# Patient Record
Sex: Female | Born: 1945 | Race: White | Hispanic: No | Marital: Married | Smoking: Never smoker
Health system: Southern US, Community
[De-identification: ages and names within clinical notes are randomized; demographics above are authoritative.]

## PROBLEM LIST (undated history)

## (undated) DIAGNOSIS — E785 Hyperlipidemia, unspecified: Secondary | ICD-10-CM

## (undated) DIAGNOSIS — M1990 Unspecified osteoarthritis, unspecified site: Secondary | ICD-10-CM

## (undated) DIAGNOSIS — M8580 Other specified disorders of bone density and structure, unspecified site: Secondary | ICD-10-CM

## (undated) HISTORY — DX: Unspecified osteoarthritis, unspecified site: M19.90

## (undated) HISTORY — DX: Hyperlipidemia, unspecified: E78.5

## (undated) HISTORY — DX: Other specified disorders of bone density and structure, unspecified site: M85.80

---
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
## 1998-02-25 ENCOUNTER — Other Ambulatory Visit: Admission: RE | Admit: 1998-02-25 | Discharge: 1998-02-25 | Payer: Self-pay | Admitting: Obstetrics and Gynecology

## 1998-03-10 ENCOUNTER — Other Ambulatory Visit: Admission: RE | Admit: 1998-03-10 | Discharge: 1998-03-10 | Payer: Self-pay | Admitting: Obstetrics and Gynecology

## 1999-03-06 ENCOUNTER — Other Ambulatory Visit: Admission: RE | Admit: 1999-03-06 | Discharge: 1999-03-06 | Payer: Self-pay | Admitting: Obstetrics and Gynecology

## 1999-05-30 ENCOUNTER — Encounter (INDEPENDENT_AMBULATORY_CARE_PROVIDER_SITE_OTHER): Payer: Self-pay | Admitting: Specialist

## 1999-05-30 ENCOUNTER — Other Ambulatory Visit: Admission: RE | Admit: 1999-05-30 | Discharge: 1999-05-30 | Payer: Self-pay | Admitting: Obstetrics and Gynecology

## 1999-09-14 ENCOUNTER — Other Ambulatory Visit: Admission: RE | Admit: 1999-09-14 | Discharge: 1999-09-14 | Payer: Self-pay | Admitting: Obstetrics and Gynecology

## 2000-03-06 ENCOUNTER — Other Ambulatory Visit: Admission: RE | Admit: 2000-03-06 | Discharge: 2000-03-06 | Payer: Self-pay | Admitting: Obstetrics and Gynecology

## 2002-10-12 ENCOUNTER — Other Ambulatory Visit: Admission: RE | Admit: 2002-10-12 | Discharge: 2002-10-12 | Payer: Self-pay | Admitting: Obstetrics and Gynecology

## 2003-11-10 ENCOUNTER — Other Ambulatory Visit: Admission: RE | Admit: 2003-11-10 | Discharge: 2003-11-10 | Payer: Self-pay | Admitting: Obstetrics and Gynecology

## 2004-11-13 ENCOUNTER — Other Ambulatory Visit: Admission: RE | Admit: 2004-11-13 | Discharge: 2004-11-13 | Payer: Self-pay | Admitting: *Deleted

## 2005-11-27 ENCOUNTER — Other Ambulatory Visit: Admission: RE | Admit: 2005-11-27 | Discharge: 2005-11-27 | Payer: Self-pay | Admitting: Obstetrics & Gynecology

## 2006-06-26 ENCOUNTER — Emergency Department (HOSPITAL_COMMUNITY): Admission: EM | Admit: 2006-06-26 | Discharge: 2006-06-26 | Payer: Self-pay | Admitting: Emergency Medicine

## 2006-12-11 ENCOUNTER — Encounter: Admission: RE | Admit: 2006-12-11 | Discharge: 2006-12-11 | Payer: Self-pay | Admitting: Orthopedic Surgery

## 2006-12-16 ENCOUNTER — Encounter: Admission: RE | Admit: 2006-12-16 | Discharge: 2006-12-16 | Payer: Self-pay | Admitting: Orthopedic Surgery

## 2006-12-23 ENCOUNTER — Other Ambulatory Visit: Admission: RE | Admit: 2006-12-23 | Discharge: 2006-12-23 | Payer: Self-pay | Admitting: Obstetrics & Gynecology

## 2007-12-29 ENCOUNTER — Other Ambulatory Visit: Admission: RE | Admit: 2007-12-29 | Discharge: 2007-12-29 | Payer: Self-pay | Admitting: Obstetrics & Gynecology

## 2008-08-26 ENCOUNTER — Ambulatory Visit: Payer: Self-pay | Admitting: Internal Medicine

## 2009-05-11 ENCOUNTER — Encounter: Admission: RE | Admit: 2009-05-11 | Discharge: 2009-05-11 | Payer: Self-pay | Admitting: Interventional Cardiology

## 2009-11-18 ENCOUNTER — Ambulatory Visit: Payer: Self-pay | Admitting: Internal Medicine

## 2009-11-22 ENCOUNTER — Ambulatory Visit: Payer: Self-pay | Admitting: Internal Medicine

## 2009-11-28 ENCOUNTER — Ambulatory Visit: Payer: Self-pay | Admitting: Internal Medicine

## 2010-03-29 ENCOUNTER — Ambulatory Visit (HOSPITAL_COMMUNITY): Admission: RE | Admit: 2010-03-29 | Discharge: 2010-03-29 | Payer: Self-pay | Admitting: Endocrinology

## 2010-06-27 ENCOUNTER — Ambulatory Visit (HOSPITAL_COMMUNITY): Admission: RE | Admit: 2010-06-27 | Discharge: 2010-06-27 | Payer: Self-pay | Admitting: Surgery

## 2010-08-13 HISTORY — PX: PARATHYROIDECTOMY: SHX19

## 2010-10-24 LAB — DIFFERENTIAL
Basophils Relative: 1 % (ref 0–1)
Lymphocytes Relative: 31 % (ref 12–46)
Lymphs Abs: 1.1 10*3/uL (ref 0.7–4.0)
Monocytes Absolute: 0.3 10*3/uL (ref 0.1–1.0)
Monocytes Relative: 10 % (ref 3–12)
Neutro Abs: 2 10*3/uL (ref 1.7–7.7)
Neutrophils Relative %: 55 % (ref 43–77)

## 2010-10-24 LAB — CBC
HCT: 38.6 % (ref 36.0–46.0)
MCHC: 34.3 g/dL (ref 30.0–36.0)
MCV: 92.7 fL (ref 78.0–100.0)
Platelets: 196 10*3/uL (ref 150–400)
RDW: 13.2 % (ref 11.5–15.5)
WBC: 3.6 10*3/uL — ABNORMAL LOW (ref 4.0–10.5)

## 2010-10-24 LAB — COMPREHENSIVE METABOLIC PANEL
Albumin: 4.2 g/dL (ref 3.5–5.2)
BUN: 9 mg/dL (ref 6–23)
Calcium: 10.9 mg/dL — ABNORMAL HIGH (ref 8.4–10.5)
Creatinine, Ser: 0.57 mg/dL (ref 0.4–1.2)
Glucose, Bld: 86 mg/dL (ref 70–99)
Potassium: 4.9 mEq/L (ref 3.5–5.1)
Total Protein: 6.9 g/dL (ref 6.0–8.3)

## 2010-10-24 LAB — SURGICAL PCR SCREEN
MRSA, PCR: NEGATIVE
Staphylococcus aureus: NEGATIVE

## 2010-10-24 LAB — URINALYSIS, ROUTINE W REFLEX MICROSCOPIC
Bilirubin Urine: NEGATIVE
Ketones, ur: NEGATIVE mg/dL
Protein, ur: NEGATIVE mg/dL
Urobilinogen, UA: 0.2 mg/dL (ref 0.0–1.0)
pH: 5.5 (ref 5.0–8.0)

## 2010-10-24 LAB — PROTIME-INR: INR: 0.94 (ref 0.00–1.49)

## 2010-10-24 LAB — URINE MICROSCOPIC-ADD ON

## 2010-11-30 ENCOUNTER — Other Ambulatory Visit: Payer: Self-pay | Admitting: Internal Medicine

## 2010-12-04 ENCOUNTER — Encounter (INDEPENDENT_AMBULATORY_CARE_PROVIDER_SITE_OTHER): Payer: BC Managed Care – PPO | Admitting: Internal Medicine

## 2010-12-04 DIAGNOSIS — Z0000 Encounter for general adult medical examination without abnormal findings: Secondary | ICD-10-CM

## 2011-05-28 ENCOUNTER — Encounter: Payer: Self-pay | Admitting: Internal Medicine

## 2011-07-02 ENCOUNTER — Other Ambulatory Visit: Payer: BC Managed Care – PPO | Admitting: Internal Medicine

## 2011-07-02 DIAGNOSIS — E559 Vitamin D deficiency, unspecified: Secondary | ICD-10-CM

## 2011-07-02 DIAGNOSIS — E7800 Pure hypercholesterolemia, unspecified: Secondary | ICD-10-CM

## 2011-07-02 DIAGNOSIS — Z131 Encounter for screening for diabetes mellitus: Secondary | ICD-10-CM

## 2011-07-02 DIAGNOSIS — E209 Hypoparathyroidism, unspecified: Secondary | ICD-10-CM

## 2011-07-02 LAB — LIPID PANEL
Cholesterol: 185 mg/dL (ref 0–200)
HDL: 65 mg/dL (ref >39)
LDL Cholesterol: 110 mg/dL — ABNORMAL HIGH (ref 0–99)
Triglycerides: 51 mg/dL (ref <150)

## 2011-07-02 LAB — CALCIUM: Calcium: 9 mg/dL (ref 8.4–10.5)

## 2011-07-03 ENCOUNTER — Ambulatory Visit (INDEPENDENT_AMBULATORY_CARE_PROVIDER_SITE_OTHER): Payer: BC Managed Care – PPO | Admitting: Internal Medicine

## 2011-07-03 ENCOUNTER — Encounter: Payer: Self-pay | Admitting: Internal Medicine

## 2011-07-03 DIAGNOSIS — Z8639 Personal history of other endocrine, nutritional and metabolic disease: Secondary | ICD-10-CM

## 2011-07-03 DIAGNOSIS — E559 Vitamin D deficiency, unspecified: Secondary | ICD-10-CM

## 2011-07-03 DIAGNOSIS — M899 Disorder of bone, unspecified: Secondary | ICD-10-CM

## 2011-07-03 DIAGNOSIS — E785 Hyperlipidemia, unspecified: Secondary | ICD-10-CM

## 2011-07-03 DIAGNOSIS — Z862 Personal history of diseases of the blood and blood-forming organs and certain disorders involving the immune mechanism: Secondary | ICD-10-CM

## 2011-07-03 DIAGNOSIS — M949 Disorder of cartilage, unspecified: Secondary | ICD-10-CM

## 2011-07-03 DIAGNOSIS — M8580 Other specified disorders of bone density and structure, unspecified site: Secondary | ICD-10-CM

## 2011-07-03 LAB — VITAMIN D 25 HYDROXY (VIT D DEFICIENCY, FRACTURES): Vit D, 25-Hydroxy: 27 ng/mL — ABNORMAL LOW (ref 30–89)

## 2011-07-15 DIAGNOSIS — M8580 Other specified disorders of bone density and structure, unspecified site: Secondary | ICD-10-CM | POA: Insufficient documentation

## 2011-07-15 DIAGNOSIS — E785 Hyperlipidemia, unspecified: Secondary | ICD-10-CM | POA: Insufficient documentation

## 2011-07-15 DIAGNOSIS — Z8639 Personal history of other endocrine, nutritional and metabolic disease: Secondary | ICD-10-CM | POA: Insufficient documentation

## 2011-07-15 NOTE — Progress Notes (Signed)
  Subjective:    Patient ID: Valerie Sanford, female    DOB: 07/03/1946, 65 y.o.   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 Gerrit Friends. She is in today for six-month followup on hyperlipidemia. Had vitamin D level of 11/28/2010. Takes vitamin D 1000 units daily. Serum calcium April 2012 was normal at 9.5 status post surgery for hyperparathyroidism. In April 2012 LDL cholesterol was 138 with a total cholesterol of 210 and an HDL cholesterol of 59. TSH was normal at that time.    Review of Systems     Objective:   Physical Exam neck supple without thyromegaly or adenopathy; chest clear; cardiac exam regular rate and rhythm        Assessment & Plan:   hyperlipidemia-stable on diet therapy alone  Osteopenia-old calcium and vitamin D therapy  History of hyperparathyroidism status post right inferior parathyroidectomy November 2011  History of vitamin D deficiency  Plan: Continue vitamin D supplementation with 1000 units daily. Continue diet and exercise for hyperlipidemia and return in 6 months for physical exam.  Note: Tdap given April 2011. Zostavax given 2011. GYN physician is Dr. Leda Quail. Patient had colonoscopy July 2006. Patient had history of hepatitis A in the 1980s. Gets annual mammogram.

## 2011-07-16 DIAGNOSIS — E559 Vitamin D deficiency, unspecified: Secondary | ICD-10-CM | POA: Insufficient documentation

## 2011-07-16 NOTE — Patient Instructions (Signed)
Continue diet exercise and vitamin D supplementation. Return in 6 months.

## 2012-01-01 ENCOUNTER — Other Ambulatory Visit: Payer: Medicare Other | Admitting: Internal Medicine

## 2012-01-01 DIAGNOSIS — Z0000 Encounter for general adult medical examination without abnormal findings: Secondary | ICD-10-CM

## 2012-01-01 DIAGNOSIS — E785 Hyperlipidemia, unspecified: Secondary | ICD-10-CM

## 2012-01-01 LAB — COMPREHENSIVE METABOLIC PANEL
AST: 17 U/L (ref 0–37)
Albumin: 4.5 g/dL (ref 3.5–5.2)
Alkaline Phosphatase: 54 U/L (ref 39–117)
Chloride: 105 mEq/L (ref 96–112)
Potassium: 4 mEq/L (ref 3.5–5.3)
Sodium: 141 mEq/L (ref 135–145)
Total Protein: 6.4 g/dL (ref 6.0–8.3)

## 2012-01-01 LAB — CBC WITH DIFFERENTIAL/PLATELET
Basophils Absolute: 0 10*3/uL (ref 0.0–0.1)
Basophils Relative: 0 % (ref 0–1)
Hemoglobin: 12.7 g/dL (ref 12.0–15.0)
Lymphocytes Relative: 29 % (ref 12–46)
MCHC: 32.4 g/dL (ref 30.0–36.0)
Monocytes Relative: 6 % (ref 3–12)
Neutro Abs: 2.9 10*3/uL (ref 1.7–7.7)
Neutrophils Relative %: 59 % (ref 43–77)
WBC: 4.9 10*3/uL (ref 4.0–10.5)

## 2012-01-01 LAB — LIPID PANEL
LDL Cholesterol: 132 mg/dL — ABNORMAL HIGH (ref 0–99)
VLDL: 22 mg/dL (ref 0–40)

## 2012-01-02 LAB — VITAMIN D 25 HYDROXY (VIT D DEFICIENCY, FRACTURES): Vit D, 25-Hydroxy: 33 ng/mL (ref 30–89)

## 2012-01-03 ENCOUNTER — Ambulatory Visit (INDEPENDENT_AMBULATORY_CARE_PROVIDER_SITE_OTHER): Payer: BC Managed Care – PPO | Admitting: Internal Medicine

## 2012-01-03 ENCOUNTER — Encounter: Payer: Self-pay | Admitting: Internal Medicine

## 2012-01-03 VITALS — BP 130/72 | HR 64 | Temp 97.6°F | Ht 67.5 in | Wt 163.0 lb

## 2012-01-03 DIAGNOSIS — Z0000 Encounter for general adult medical examination without abnormal findings: Secondary | ICD-10-CM

## 2012-01-03 DIAGNOSIS — Z23 Encounter for immunization: Secondary | ICD-10-CM

## 2012-01-03 LAB — POCT URINALYSIS DIPSTICK
Leukocytes, UA: NEGATIVE
Nitrite, UA: NEGATIVE
Protein, UA: NEGATIVE
Urobilinogen, UA: NEGATIVE

## 2012-01-03 MED ORDER — PNEUMOCOCCAL VAC POLYVALENT 25 MCG/0.5ML IJ INJ: 0.5000 mL | INJECTION | Freq: Once | INTRAMUSCULAR | Status: DC

## 2012-01-13 ENCOUNTER — Encounter: Payer: Self-pay | Admitting: Internal Medicine
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
## 2012-07-03 ENCOUNTER — Other Ambulatory Visit: Payer: Medicare Other | Admitting: Internal Medicine

## 2012-07-03 DIAGNOSIS — E785 Hyperlipidemia, unspecified: Secondary | ICD-10-CM

## 2012-07-03 LAB — LIPID PANEL
Cholesterol: 200 mg/dL (ref 0–200)
VLDL: 18 mg/dL (ref 0–40)

## 2012-07-07 ENCOUNTER — Other Ambulatory Visit: Payer: BC Managed Care – PPO | Admitting: Internal Medicine

## 2012-07-08 ENCOUNTER — Ambulatory Visit (INDEPENDENT_AMBULATORY_CARE_PROVIDER_SITE_OTHER): Payer: Medicare Other | Admitting: Internal Medicine

## 2012-07-08 ENCOUNTER — Encounter: Payer: Self-pay | Admitting: Internal Medicine

## 2012-07-08 VITALS — BP 136/76 | HR 80 | Temp 97.4°F | Wt 168.0 lb

## 2012-07-08 DIAGNOSIS — E785 Hyperlipidemia, unspecified: Secondary | ICD-10-CM

## 2012-07-08 DIAGNOSIS — K219 Gastro-esophageal reflux disease without esophagitis: Secondary | ICD-10-CM

## 2012-07-08 DIAGNOSIS — R079 Chest pain, unspecified: Secondary | ICD-10-CM

## 2012-07-08 NOTE — Progress Notes (Signed)
  Subjective:    Patient ID: Valerie Sanford, female    DOB: 08-05-1946, 66 y.o.   MRN: [id number]  HPI 66 year old white female here today to followup on hyperlipidemia. Is not a lipid-lowering medication. 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 Has been "stress eating ". Not following a strict low-fat diet. Also has had some "heartburn" which turns out to be some substernal chest pain described as a burning sensation. This occurs at rest. She does have a glass of wine at night to relax. Sometimes eats late. Enjoys spicy foods such as pimento cheese with jalapeno peppers. This does sound like GE reflux. Doesn't have frank water brash. Have suggested Zantac 150 mg twice daily to see if it improves. We can also check H. pylori antibody. However she seems to be concerned about whether or not this could represent heart issues. This discomfort does not radiate into her neck or down her left arm. There is no diuresis or nausea with it. It occurs at rest. EKG done today shows no change from previous EKG done on Medicare exam May 2013.    Review of Systems     Objective:   Physical Exam Neck is supple without JVD thyromegaly or carotid bruits. Chest clear to auscultation. Cardiac exam regular rate and rhythm normal S1 and S2. Extremities without edema.  Abdomen: No hepatosplenomegaly masses or tenderness.        Assessment & Plan:  GE reflux-plan is try Zantac 150 mg twice daily and see if symptoms improve if not she is to call me and we will refer her to cardiologist. We discussed foods that would aggravate GE reflux.  Chest pain-likely represents GE reflux. EKG shows no change from previous EKG.  Hyperlipidemia-. No significant improvement over past 6 months. We'll give her another chance and reevaluate in 6 months. Needs to diet and exercise. Total cholesterol has improved  from 206-200. LDL cholesterol improved from 1:30 to 126. However a year ago total cholesterol was 185 and LDL cholesterol was 110. I would like to see it there again.  Medicare exam is due late May early June.

## 2012-07-08 NOTE — Patient Instructions (Addendum)
Take Zantac 150 mg twice daily for GE reflux. If symptoms do not improve, consider cardiology referral. Return in 6 months for physical exam.

## 2012-08-08 NOTE — Addendum Note (Signed)
Addended by: Judy Pimple on: 08/08/2012 12:51 PM   Modules accepted: Orders

## 2013-01-13 ENCOUNTER — Other Ambulatory Visit: Payer: Medicare Other | Admitting: Internal Medicine

## 2013-01-13 DIAGNOSIS — Z0000 Encounter for general adult medical examination without abnormal findings: Secondary | ICD-10-CM

## 2013-01-13 DIAGNOSIS — Z13228 Encounter for screening for other metabolic disorders: Secondary | ICD-10-CM

## 2013-01-13 DIAGNOSIS — Z130 Encounter for screening for diseases of the blood and blood-forming organs and certain disorders involving the immune mechanism: Secondary | ICD-10-CM

## 2013-01-13 DIAGNOSIS — Z13220 Encounter for screening for lipoid disorders: Secondary | ICD-10-CM

## 2013-01-13 DIAGNOSIS — Z1329 Encounter for screening for other suspected endocrine disorder: Secondary | ICD-10-CM

## 2013-01-13 LAB — CBC WITH DIFFERENTIAL/PLATELET
Eosinophils Absolute: 0.2 10*3/uL (ref 0.0–0.7)
Hemoglobin: 12.4 g/dL (ref 12.0–15.0)
Lymphs Abs: 1.2 10*3/uL (ref 0.7–4.0)
MCH: 29.5 pg (ref 26.0–34.0)
MCV: 87.6 fL (ref 78.0–100.0)
Monocytes Relative: 8 % (ref 3–12)
Neutrophils Relative %: 64 % (ref 43–77)
RBC: 4.2 MIL/uL (ref 3.87–5.11)

## 2013-01-14 LAB — COMPREHENSIVE METABOLIC PANEL
Albumin: 4 g/dL (ref 3.5–5.2)
CO2: 27 mEq/L (ref 19–32)
Calcium: 9.2 mg/dL (ref 8.4–10.5)
Glucose, Bld: 78 mg/dL (ref 70–99)
Sodium: 138 mEq/L (ref 135–145)
Total Bilirubin: 0.7 mg/dL (ref 0.3–1.2)
Total Protein: 6.2 g/dL (ref 6.0–8.3)

## 2013-01-14 LAB — LIPID PANEL
Cholesterol: 199 mg/dL (ref 0–200)
Triglycerides: 63 mg/dL (ref <150)
VLDL: 13 mg/dL (ref 0–40)

## 2013-01-15 ENCOUNTER — Ambulatory Visit (INDEPENDENT_AMBULATORY_CARE_PROVIDER_SITE_OTHER): Payer: Medicare Other | Admitting: Internal Medicine

## 2013-01-15 ENCOUNTER — Ambulatory Visit: Payer: Medicare Other | Admitting: Internal Medicine

## 2013-01-15 VITALS — BP 120/64 | HR 72 | Temp 97.9°F

## 2013-01-15 DIAGNOSIS — Z862 Personal history of diseases of the blood and blood-forming organs and certain disorders involving the immune mechanism: Secondary | ICD-10-CM

## 2013-01-15 DIAGNOSIS — E785 Hyperlipidemia, unspecified: Secondary | ICD-10-CM

## 2013-01-15 DIAGNOSIS — Z8619 Personal history of other infectious and parasitic diseases: Secondary | ICD-10-CM

## 2013-01-15 DIAGNOSIS — M899 Disorder of bone, unspecified: Secondary | ICD-10-CM

## 2013-01-15 DIAGNOSIS — Z0000 Encounter for general adult medical examination without abnormal findings: Secondary | ICD-10-CM

## 2013-01-15 DIAGNOSIS — M8580 Other specified disorders of bone density and structure, unspecified site: Secondary | ICD-10-CM

## 2013-01-15 DIAGNOSIS — Z8639 Personal history of other endocrine, nutritional and metabolic disease: Secondary | ICD-10-CM

## 2013-01-15 LAB — POCT URINALYSIS DIPSTICK
Blood, UA: NEGATIVE
Glucose, UA: NEGATIVE
Ketones, UA: NEGATIVE
Leukocytes, UA: NEGATIVE
Nitrite, UA: NEGATIVE
Protein, UA: NEGATIVE
Spec Grav, UA: <=1.005
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
## 2013-05-20 ENCOUNTER — Encounter: Payer: Self-pay | Admitting: Gynecology

## 2013-07-11 ENCOUNTER — Encounter: Payer: Self-pay | Admitting: Internal Medicine

## 2013-07-11 NOTE — Patient Instructions (Signed)
Continue to watch diet and exercise for elevated LDL cholesterol. Return in one year or as needed. Have bone density study. Annual flu vaccine recommended.

## 2013-07-24 ENCOUNTER — Encounter: Payer: Self-pay | Admitting: Gynecology

## 2013-07-27 ENCOUNTER — Ambulatory Visit: Payer: Self-pay | Admitting: Gynecology

## 2013-07-30 ENCOUNTER — Ambulatory Visit: Payer: Self-pay | Admitting: Gynecology

## 2013-07-31 ENCOUNTER — Ambulatory Visit: Payer: Self-pay | Admitting: Gynecology

## 2013-08-31 ENCOUNTER — Ambulatory Visit (INDEPENDENT_AMBULATORY_CARE_PROVIDER_SITE_OTHER): Payer: Medicare Other | Admitting: Gynecology

## 2013-08-31 ENCOUNTER — Encounter: Payer: Self-pay | Admitting: Gynecology

## 2013-08-31 VITALS — BP 126/68 | HR 80 | Resp 12 | Ht 67.5 in | Wt 164.0 lb

## 2013-08-31 DIAGNOSIS — Z01419 Encounter for gynecological examination (general) (routine) without abnormal findings: Secondary | ICD-10-CM

## 2013-08-31 DIAGNOSIS — N9089 Other specified noninflammatory disorders of vulva and perineum: Secondary | ICD-10-CM

## 2013-08-31 MED ORDER — TRIAMCINOLONE ACETONIDE 0.5 % EX OINT: 1.0000 "application " | TOPICAL_OINTMENT | Freq: Two times a day (BID) | CUTANEOUS | Status: DC

## 2013-08-31 NOTE — Progress Notes (Signed)
68 y.o. Married Caucasian female   G2P2002 here for annual exam.  She does not report hot flashes, does not have night sweats, does have vaginal dryness.  She is using lubricants, [place].  Pt reports itching that she noticed after bathing, started 6w ago.  Pt states that it is better after using medicated powder. Pt denies vaginal discharge.  Pt takes tub bath nightly-hot, will sometime use bubbles. No recent change in vaginal lubricants, laundry detergents, no underwear at night.   No LMP recorded. Patient is postmenopausal.          Sexually active: yes  The current method of family planning is post menopausal status.    Exercising: yes  walking Last pap:  Abnormal PAP: no  Mammogram: 06/09/13 Bi-Rads 0 BSE: Yes  Colonoscopy: within 10 years DEXA:  2011 Alcohol:  Glass of wine occ  Tobacco: no  Labs: PCP    Health Maintenance  Topic Date Due  . Influenza Vaccine  03/13/2013  . Colonoscopy  02/12/2015  . Mammogram  06/10/2015  . Tetanus/tdap  11/26/2019  . Pneumococcal Polysaccharide Vaccine Age 28 And Over  Completed  . Zostavax  Completed    Family History  Problem Relation Age of Onset  . Hypertension Mother   . Osteoporosis Mother     Patient Active Problem List   Diagnosis Date Noted  . GE reflux 07/08/2012  . Vitamin d deficiency 07/16/2011  . Hyperlipidemia 07/15/2011  . History of hyperparathyroidism 07/15/2011  . Osteopenia 07/15/2011    Past Medical History  Diagnosis Date  . Arthritis     Neck/Hands- Not Rheumatoid    Past Surgical History  Procedure Laterality Date  . Parathyroidectomy  2012    Allergies: Review of patient's allergies indicates no known allergies.  Current Outpatient Prescriptions  Medication Sig Dispense Refill  . calcium-vitamin D (OSCAL WITH D) 500-200 MG-UNIT per tablet Take 1 tablet by mouth daily.        . cholecalciferol (VITAMIN D) 1000 UNITS tablet Take 1,000 Units by mouth daily.       Current Facility-Administered  Medications  Medication Dose Route Frequency Provider Last Rate Last Dose  . pneumococcal 23 valent vaccine (PNU-IMMUNE) injection 0.5 mL  0.5 mL Intramuscular Once Margaree Mackintosh, MD        ROS: Pertinent items are noted in HPI.  Exam:    BP 126/68  Pulse 80  Resp 12  Ht 5' 7.5" (1.715 m)  Wt 164 lb (74.39 kg)  BMI 25.29 kg/m2 Weight change: @WEIGHTCHANGE @ Last 3 height recordings:  Ht Readings from Last 3 Encounters:  08/31/13 5' 7.5" (1.715 m)  01/03/12 5' 7.5" (1.715 m)  07/03/11 5' 7.25" (1.708 m)   General appearance: alert, cooperative and appears stated age Head: Normocephalic, without obvious abnormality, atraumatic Neck: no adenopathy, no carotid bruit, no JVD, supple, symmetrical, trachea midline and thyroid not enlarged, symmetric, no tenderness/mass/nodules Lungs: clear to auscultation bilaterally Breasts: normal appearance, no masses or tenderness Heart: regular rate and rhythm, S1, S2 normal, no murmur, click, rub or gallop Abdomen: soft, non-tender; bowel sounds normal; no masses,  no organomegaly Extremities: extremities normal, atraumatic, no cyanosis or edema Skin: Skin color, texture, turgor normal. No rashes or lesions Lymph nodes: Cervical, supraclavicular, and axillary nodes normal. no inguinal nodes palpated Neurologic: Grossly normal   Pelvic: External genitalia:  Labial lesion consistent with scratching on right, scattered raised lesions on upper inner right thigh  Urethra: normal appearing urethra with no masses, tenderness or lesions              Bartholins and Skenes: normal                 Vagina: normal appearing vagina with normal color and discharge, no lesions, atrophic              Cervix: normal appearance              Pap taken: no        Bimanual Exam:  Uterus:  uterus is normal size, shape, consistency and nontender                                      Adnexa:    no masses                                      Rectovaginal:  Confirms                                      Anus:  normal sphincter tone, no lesions  A: well woman Contact dermatitis     P: mammogram counseled on breast self exam, mammography screening, adequate intake of calcium and vitamin D, diet and exercise return annually or prn Discussed PAP guideline changes, importance of weight bearing exercises, calcium, vit D and balanced diet.  An After Visit Summary was printed and given to the patient.

## 2013-08-31 NOTE — Patient Instructions (Addendum)

## 2013-10-21 ENCOUNTER — Telehealth: Payer: Self-pay | Admitting: Gynecology

## 2013-10-21 NOTE — Telephone Encounter (Signed)
Patient calling with increased skin irritation on inner leg near R labia that has been ongoing since January. Using Kenalog since Dr. Farrel GobbleLathrop has rx cream for same. She feels it is worsening and states "I think I need an oral treatment or an antibiotic". Rash has not spread, remains in same area. Agreeable to office visit with Dr. Farrel GobbleLathrop for evaluation since c/o worsening symptoms. Requests Friday appointment.    Routing to provider for final review. Patient agreeable to disposition. Will close encounter

## 2013-10-21 NOTE — Telephone Encounter (Signed)
Patient is calling about a rash on the inside of her leg she told lathrop about. Lathrop gave her some medication for the issue but it has not gotten better if anything it has gotten worse. Lathrop told her to call if anything changed.

## 2013-10-23 ENCOUNTER — Other Ambulatory Visit: Payer: Self-pay | Admitting: Gynecology

## 2013-10-23 ENCOUNTER — Ambulatory Visit (INDEPENDENT_AMBULATORY_CARE_PROVIDER_SITE_OTHER): Payer: Medicare Other | Admitting: Gynecology

## 2013-10-23 ENCOUNTER — Encounter: Payer: Self-pay | Admitting: Gynecology

## 2013-10-23 VITALS — BP 120/80 | HR 88 | Resp 16 | Ht 67.5 in | Wt 165.0 lb

## 2013-10-23 DIAGNOSIS — N9089 Other specified noninflammatory disorders of vulva and perineum: Secondary | ICD-10-CM

## 2013-10-23 MED ORDER — LIDOCAINE HCL (PF) 2 % IJ SOLN
2.0000 mL | Freq: Once | INTRAMUSCULAR | Status: AC
  Administered 2013-10-23: 2 mL via INTRADERMAL

## 2013-10-23 NOTE — Progress Notes (Signed)
Subjective:     Patient ID: Valerie Sanford, female   DOB: 1945/11/22, 68 y.o.   MRN: [id number]  HPI Comments: Pt returns reporting that her vuvlar lesion has not improved and she thinks may have gotten a little worse.  She has used the kenalog ointment as prescribed, she reports that it feels irritated and that now she has noticed a white head on her thigh that is bothersome.  She denies any other changes.    Review of Systems  Constitutional: Negative for fever and fatigue.  Skin: Positive for rash.       Objective:   Physical Exam  Nursing note and vitals reviewed. Constitutional: She is oriented to person, place, and time. She appears well-developed and well-nourished.  Genitourinary: Vagina normal.    There is rash and lesion on the right labia. There is no tenderness or injury on the right labia. There is no rash, tenderness, lesion or injury on the left labia.  Lymphadenopathy:       Right: No inguinal adenopathy present.       Left: No inguinal adenopathy present.  Neurological: She is alert and oriented to person, place, and time.  Skin: Skin is warm and dry. Rash noted. There is erythema (inner thigh on right ).       Assessment:     Vulvar lesion with rash on thingh     Plan:     Did not respond to kenalog ointment Recommend vulvar biopsy-consent obtained. Area prepped with lidocaine jelly 2%, cleansed with betadine, injected with 0.3cc 2% lidocaine, 5mm punch base shraply excised.  Base treated with silver nitrate, hemostatic.  Pt tolerated well Tissue to pathology.  Will contact with results.

## 2013-10-27 ENCOUNTER — Telehealth: Payer: Self-pay | Admitting: Gynecology

## 2013-10-27 ENCOUNTER — Encounter: Payer: Self-pay | Admitting: Gynecology

## 2013-10-27 LAB — IPS CERVICAL/ECC/EMB/VULVAR/VAGINAL BIOPSY

## 2013-10-27 NOTE — Telephone Encounter (Signed)
LM to call to discuss biopsy results.Marland Kitchen..Marland Kitchen

## 2013-10-28 NOTE — Telephone Encounter (Signed)
Pt called back, informed about biopsy with -POLYPOID SQUAMOUS MUCOSA WITH FOCAL HYPERGRANULOSIS AND HYPERKERATOSIS AND PAPILLARY HYPERPLASIA -FOCAL ATYPIA PRESENT -SEE COMMENT COMMENT: Some atypical features are present and viral change cannot be entirely excluded Suggested that we repeat biopsy and do colposcopy, procedure outlined to pt. Pt was hesitent to have another biopsy.  Explained that last biopsy was inconclusive and did not explain her symptoms and that although not cancer that focal atypia was seen and a viral element. She will think about it but will not agree to biopsy, we will in the interim send the biopsy to [facility] for second opinion

## 2013-10-28 NOTE — Telephone Encounter (Signed)
LM again on cell to CB for results and follow up

## 2013-10-29 ENCOUNTER — Encounter: Payer: Self-pay | Admitting: Obstetrics & Gynecology

## 2013-10-30 ENCOUNTER — Telehealth: Payer: Self-pay | Admitting: Gynecology

## 2013-10-30 DIAGNOSIS — B372 Candidiasis of skin and nail: Secondary | ICD-10-CM

## 2013-10-30 NOTE — Telephone Encounter (Signed)
1. Patient calling to check on pathology results from second lab source. 2. Results from urine test?

## 2013-10-30 NOTE — Telephone Encounter (Signed)
Spoke with patient. Advised results have not been received yet from [facility]. Advised would send request to Dr. Farrel GobbleLathrop to advised patient was waiting for results  Advised no urine results in the system for her. Advised we may have collected urine but that if not having problems, may not have run test. Patient agreeable.   Routing to provider for final review. Patient agreeable to disposition. Will close encounter

## 2013-11-04 ENCOUNTER — Other Ambulatory Visit: Payer: Self-pay | Admitting: Gynecology

## 2013-11-04 MED ORDER — NYSTATIN-TRIAMCINOLONE 100000-0.1 UNIT/GM-% EX OINT: 1.0000 "application " | TOPICAL_OINTMENT | Freq: Two times a day (BID) | CUTANEOUS | Status: DC

## 2013-11-04 MED ORDER — FLUCONAZOLE 200 MG PO TABS: 200.0000 mg | ORAL_TABLET | Freq: Every day | ORAL | Status: DC

## 2013-11-04 NOTE — Telephone Encounter (Signed)
Pt informed of reread of her biopsy by [facility].  dermopatholgist reviewed and felt it was inflammation and fungal element was noted.   Pt states that after our lst conversation, she restarted using the kenalog and was keeping the area dry and OTA as much as possible.  She states that it has started to improve. I suggest that the topical steroid will treat the inflammation but she should also treat the fungus, will change to mycolog and oral fluconazole. We will see her back in 1w-appt for 4/1 made 

## 2013-11-09 ENCOUNTER — Ambulatory Visit (INDEPENDENT_AMBULATORY_CARE_PROVIDER_SITE_OTHER): Payer: Medicare Other | Admitting: Gynecology

## 2013-11-09 ENCOUNTER — Encounter: Payer: Self-pay | Admitting: Gynecology

## 2013-11-09 ENCOUNTER — Telehealth: Payer: Self-pay | Admitting: Gynecology

## 2013-11-09 VITALS — BP 122/68 | Temp 98.0°F | Resp 16 | Ht 67.5 in | Wt 162.0 lb

## 2013-11-09 DIAGNOSIS — N3000 Acute cystitis without hematuria: Secondary | ICD-10-CM

## 2013-11-09 DIAGNOSIS — N762 Acute vulvitis: Secondary | ICD-10-CM

## 2013-11-09 DIAGNOSIS — N760 Acute vaginitis: Secondary | ICD-10-CM

## 2013-11-09 DIAGNOSIS — R300 Dysuria: Secondary | ICD-10-CM

## 2013-11-09 LAB — POCT URINALYSIS DIPSTICK
Blood, UA: 2
PH UA: 5
Urobilinogen, UA: NEGATIVE

## 2013-11-09 MED ORDER — CIPROFLOXACIN HCL 500 MG PO TABS: 500.0000 mg | ORAL_TABLET | Freq: Two times a day (BID) | ORAL | Status: DC

## 2013-11-09 MED ORDER — PHENAZOPYRIDINE HCL 200 MG PO TABS: 200.0000 mg | ORAL_TABLET | Freq: Three times a day (TID) | ORAL | Status: DC | PRN

## 2013-11-09 NOTE — Progress Notes (Signed)
Subjective:     Patient ID: Valerie Sanford, female   DOB: 1945-11-26, 68 y.o.   MRN: [id number]  HPI Comments: Pt here for acute dysuria since this am. See note.  In addition she is here for f/u vulvar fungal infection pt is tolerating fluconazole and is using the mycolog.    Dysuria  This is a new problem. The current episode started today. The problem has been rapidly worsening. Associated symptoms include frequency, hesitancy and urgency. Pertinent negatives include no chills, discharge, flank pain, hematuria, nausea or vomiting. She has tried nothing for the symptoms. There is no history of recurrent UTIs or urinary stasis.     Review of Systems  Constitutional: Negative for chills.  Gastrointestinal: Negative for nausea and vomiting.  Genitourinary: Positive for dysuria, hesitancy, urgency and frequency. Negative for hematuria and flank pain.       Objective:   Physical Exam  Nursing note and vitals reviewed. Constitutional: She appears well-developed and well-nourished.  Abdominal: There is no CVA tenderness.  Genitourinary:          Assessment:     Yeast vulvitis Acute cystitis     Plan:     Finish the fluconazole and mycolog  cipro  pyridium

## 2013-11-09 NOTE — Telephone Encounter (Signed)
Spoke with patient. She is having  Urinary frequency and urgency since the middle of the night last night. Feels as though she has chills right now. No flank pain or back pain, denies n/v/d. No hx of chronic UTI's. Advised will need office visit for evaluation. Patient is agreeable but that she lives one hour away and does want to travel to our office both today and Wednesday as she is scheduled for a follow up for vulvar lesion.  Scheduled for today at 1545 with Dr. Farrel GobbleLathrop for urinary frequency and follow up for vulvar lesion.   Routing to provider for final review. Patient agreeable to disposition. Will close encounter

## 2013-11-09 NOTE — Patient Instructions (Signed)
Urinary Tract Infection  Urinary tract infections (UTIs) can develop anywhere along your urinary tract. Your urinary tract is your body's drainage system for removing wastes and extra water. Your urinary tract includes two kidneys, two ureters, a bladder, and a urethra. Your kidneys are a pair of bean-shaped organs. Each kidney is about the size of your fist. They are located below your ribs, one on each side of your spine.  CAUSES  Infections are caused by microbes, which are microscopic organisms, including fungi, viruses, and bacteria. These organisms are so small that they can only be seen through a microscope. Bacteria are the microbes that most commonly cause UTIs.  SYMPTOMS   Symptoms of UTIs may vary by age and gender of the patient and by the location of the infection. Symptoms in young women typically include a frequent and intense urge to urinate and a painful, burning feeling in the bladder or urethra during urination. Older women and men are more likely to be tired, shaky, and weak and have muscle aches and abdominal pain. A fever may mean the infection is in your kidneys. Other symptoms of a kidney infection include pain in your back or sides below the ribs, nausea, and vomiting.  DIAGNOSIS  To diagnose a UTI, your caregiver will ask you about your symptoms. Your caregiver also will ask to provide a urine sample. The urine sample will be tested for bacteria and white blood cells. White blood cells are made by your body to help fight infection.  TREATMENT   Typically, UTIs can be treated with medication. Because most UTIs are caused by a bacterial infection, they usually can be treated with the use of antibiotics. The choice of antibiotic and length of treatment depend on your symptoms and the type of bacteria causing your infection.  HOME CARE INSTRUCTIONS   If you were prescribed antibiotics, take them exactly as your caregiver instructs you. Finish the medication even if you feel better after you  have only taken some of the medication.   Drink enough water and fluids to keep your urine clear or pale yellow.   Avoid caffeine, tea, and carbonated beverages. They tend to irritate your bladder.   Empty your bladder often. Avoid holding urine for long periods of time.   Empty your bladder before and after sexual intercourse.   After a bowel movement, women should cleanse from front to back. Use each tissue only once.  SEEK MEDICAL CARE IF:    You have back pain.   You develop a fever.   Your symptoms do not begin to resolve within 3 days.  SEEK IMMEDIATE MEDICAL CARE IF:    You have severe back pain or lower abdominal pain.   You develop chills.   You have nausea or vomiting.   You have continued burning or discomfort with urination.  MAKE SURE YOU:    Understand these instructions.   Will watch your condition.   Will get help right away if you are not doing well or get worse.  Document Released: 05/09/2005 Document Revised: 01/29/2012 Document Reviewed: 09/07/2011  ExitCare Patient Information 2014 [employer].

## 2013-11-09 NOTE — Telephone Encounter (Signed)
Pt is wanting to have a prescription called into the pharmacy for a UTI. She has an appointment already for recheck on 11/11/13 and does not want to come in twice.

## 2013-11-11 ENCOUNTER — Ambulatory Visit: Payer: BC Managed Care – PPO | Admitting: Gynecology

## 2013-11-11 LAB — URINE CULTURE

## 2013-11-11 NOTE — Addendum Note (Signed)
Addended by: Lorraine LaxSHAW, Rhonda Linan J on: 11/11/2013 02:02 PM   Modules accepted: Orders

## 2013-12-08 ENCOUNTER — Telehealth: Payer: Self-pay | Admitting: Gynecology

## 2013-12-08 NOTE — Telephone Encounter (Signed)
Patient walked in with a statement from [facility] stating her insurance was submitted to them. Patient says she called the billing number on the statement to give her insurance info and was told someone from our office will need to supply them with her insurance info. Patient also has a question regarding her BCBS EOB. Patient is asking what does E16 (Our records show that you have primary insurance with another carrier. Provider must resubmit claim with EOB from primary carrier.) I put the copies of her EOB and [facility] statement outside your door.

## 2014-03-29 ENCOUNTER — Other Ambulatory Visit: Payer: Medicare Other | Admitting: Internal Medicine

## 2014-03-29 ENCOUNTER — Ambulatory Visit (INDEPENDENT_AMBULATORY_CARE_PROVIDER_SITE_OTHER): Payer: Medicare Other | Admitting: Internal Medicine

## 2014-03-29 ENCOUNTER — Encounter: Payer: Self-pay | Admitting: Internal Medicine

## 2014-03-29 VITALS — BP 136/62 | HR 80 | Temp 97.7°F | Ht 67.75 in | Wt 161.0 lb

## 2014-03-29 DIAGNOSIS — Z862 Personal history of diseases of the blood and blood-forming organs and certain disorders involving the immune mechanism: Secondary | ICD-10-CM

## 2014-03-29 DIAGNOSIS — E7800 Pure hypercholesterolemia, unspecified: Secondary | ICD-10-CM

## 2014-03-29 DIAGNOSIS — M899 Disorder of bone, unspecified: Secondary | ICD-10-CM

## 2014-03-29 DIAGNOSIS — Z1329 Encounter for screening for other suspected endocrine disorder: Secondary | ICD-10-CM

## 2014-03-29 DIAGNOSIS — Z130 Encounter for screening for diseases of the blood and blood-forming organs and certain disorders involving the immune mechanism: Secondary | ICD-10-CM

## 2014-03-29 DIAGNOSIS — M949 Disorder of cartilage, unspecified: Secondary | ICD-10-CM

## 2014-03-29 DIAGNOSIS — M8580 Other specified disorders of bone density and structure, unspecified site: Secondary | ICD-10-CM

## 2014-03-29 DIAGNOSIS — E559 Vitamin D deficiency, unspecified: Secondary | ICD-10-CM

## 2014-03-29 DIAGNOSIS — Z0000 Encounter for general adult medical examination without abnormal findings: Secondary | ICD-10-CM

## 2014-03-29 DIAGNOSIS — Z8639 Personal history of other endocrine, nutritional and metabolic disease: Secondary | ICD-10-CM

## 2014-03-29 DIAGNOSIS — E785 Hyperlipidemia, unspecified: Secondary | ICD-10-CM

## 2014-03-29 LAB — CBC WITH DIFFERENTIAL/PLATELET
Basophils Absolute: 0 K/uL (ref 0.0–0.1)
Basophils Relative: 1 % (ref 0–1)
Eosinophils Absolute: 0.2 K/uL (ref 0.0–0.7)
Eosinophils Relative: 5 % (ref 0–5)
HCT: 36.7 % (ref 36.0–46.0)
Hemoglobin: 12.6 g/dL (ref 12.0–15.0)
Lymphocytes Relative: 35 % (ref 12–46)
Lymphs Abs: 1.4 K/uL (ref 0.7–4.0)
MCH: 30 pg (ref 26.0–34.0)
MCHC: 34.3 g/dL (ref 30.0–36.0)
MCV: 87.4 fL (ref 78.0–100.0)
Monocytes Absolute: 0.2 K/uL (ref 0.1–1.0)
Monocytes Relative: 6 % (ref 3–12)
Neutro Abs: 2.1 K/uL (ref 1.7–7.7)
Neutrophils Relative %: 53 % (ref 43–77)
Platelets: 200 K/uL (ref 150–400)
RBC: 4.2 MIL/uL (ref 3.87–5.11)
RDW: 13.7 % (ref 11.5–15.5)
WBC: 3.9 K/uL — ABNORMAL LOW (ref 4.0–10.5)

## 2014-03-29 LAB — LIPID PANEL
Cholesterol: 208 mg/dL — ABNORMAL HIGH (ref 0–200)
HDL: 56 mg/dL
LDL Cholesterol: 136 mg/dL — ABNORMAL HIGH (ref 0–99)
Total CHOL/HDL Ratio: 3.7 ratio
Triglycerides: 81 mg/dL
VLDL: 16 mg/dL (ref 0–40)

## 2014-03-29 LAB — POCT URINALYSIS DIPSTICK
Bilirubin, UA: NEGATIVE
GLUCOSE UA: NEGATIVE
Ketones, UA: NEGATIVE
Leukocytes, UA: NEGATIVE
NITRITE UA: NEGATIVE
PH UA: 6.5
Protein, UA: NEGATIVE
RBC UA: NEGATIVE
Spec Grav, UA: 1.01
UROBILINOGEN UA: NEGATIVE

## 2014-03-29 LAB — COMPREHENSIVE METABOLIC PANEL WITH GFR
ALT: 9 U/L (ref 0–35)
AST: 14 U/L (ref 0–37)
Albumin: 4.3 g/dL (ref 3.5–5.2)
Alkaline Phosphatase: 49 U/L (ref 39–117)
BUN: 9 mg/dL (ref 6–23)
CO2: 29 meq/L (ref 19–32)
Calcium: 9 mg/dL (ref 8.4–10.5)
Chloride: 106 meq/L (ref 96–112)
Creat: 0.59 mg/dL (ref 0.50–1.10)
Glucose, Bld: 92 mg/dL (ref 70–99)
Potassium: 4.4 meq/L (ref 3.5–5.3)
Sodium: 142 meq/L (ref 135–145)
Total Bilirubin: 0.6 mg/dL (ref 0.2–1.2)
Total Protein: 6.1 g/dL (ref 6.0–8.3)

## 2014-03-29 LAB — TSH: TSH: 1.681 u[IU]/mL (ref 0.350–4.500)
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
## 2014-03-30 LAB — VITAMIN D 25 HYDROXY (VIT D DEFICIENCY, FRACTURES): VIT D 25 HYDROXY: 23 ng/mL — AB (ref 30–89)

## 2014-05-09 NOTE — Patient Instructions (Signed)
Have mammogram and bone density study in October. Return in one year or as needed. Watch diet and exercise.

## 2014-06-14 ENCOUNTER — Encounter: Payer: Self-pay | Admitting: Internal Medicine

## 2014-06-22 ENCOUNTER — Encounter: Payer: Self-pay | Admitting: Gynecology

## 2014-06-23 ENCOUNTER — Encounter: Payer: Self-pay | Admitting: *Deleted

## 2014-07-19 ENCOUNTER — Telehealth: Payer: Self-pay

## 2014-07-19 NOTE — Telephone Encounter (Signed)
[facility], date unknown.

## 2014-09-01 ENCOUNTER — Ambulatory Visit: Payer: BC Managed Care – PPO | Admitting: Gynecology

## 2014-09-07 ENCOUNTER — Ambulatory Visit: Payer: BC Managed Care – PPO | Admitting: Nurse Practitioner

## 2014-09-10 ENCOUNTER — Telehealth: Payer: Self-pay | Admitting: Nurse Practitioner

## 2014-09-10 NOTE — Telephone Encounter (Signed)
LMTCB to schedule her annual appointment. BCBS authorization # [id number]

## 2014-09-14 ENCOUNTER — Encounter: Payer: Self-pay | Admitting: Nurse Practitioner

## 2014-09-14 ENCOUNTER — Ambulatory Visit (INDEPENDENT_AMBULATORY_CARE_PROVIDER_SITE_OTHER): Payer: Medicare Other | Admitting: Nurse Practitioner

## 2014-09-14 VITALS — BP 130/78 | HR 72 | Ht 67.25 in | Wt 163.0 lb

## 2014-09-14 DIAGNOSIS — Z0000 Encounter for general adult medical examination without abnormal findings: Secondary | ICD-10-CM

## 2014-09-14 DIAGNOSIS — M8580 Other specified disorders of bone density and structure, unspecified site: Secondary | ICD-10-CM

## 2014-09-14 DIAGNOSIS — Z01419 Encounter for gynecological examination (general) (routine) without abnormal findings: Secondary | ICD-10-CM

## 2014-09-14 NOTE — Progress Notes (Signed)
Patient ID: Valerie Sanford, female   DOB: [birth year], 69 y.o.   MRN: [id number] 69 y.o. G11P2002 Married  Caucasian Fe here for annual exam. She does have vaginal dryness and is using OTC lubrication.  Sometimes this helps.  She has several concerns about the need for GYN evaluations and pap.  She is given pap guidelines and continue the need for GYN evaluation here or at PCP.  She decided will return here.  She had several different providers recently and felt like she was changed around - thought that may have been because of Medicare.  Gave her reassurance that we are happy to care for her and will try and make visits consistent.  Last Vit D with PCP was 23.  Patient's last menstrual period was 02/11/2000.          Sexually active: Yes.    The current method of family planning is post menopausal status.    Exercising: Yes.    walking Smoker:  no  Health Maintenance: Pap:  07/24/11, negative with neg  HR HPV MMG:  06/09/14, Bi-Rads 1:  Negative  Colonoscopy:  2015, normal, repeat in 10 years BMD:   02/23/10, -2.1/-1.5R/-1.7L TDaP:  11/25/09 Labs: PCP takes care of all labs (in EPIC)   reports that she has never smoked. She has never used smokeless tobacco. She reports that she drinks alcohol. She reports that she does not use illicit drugs.  Past Medical History  Diagnosis Date  . Arthritis     Neck/Hands- Not Rheumatoid    Past Surgical History  Procedure Laterality Date  . Parathyroidectomy  2012    Current Outpatient Prescriptions  Medication Sig Dispense Refill  . calcium-vitamin D (OSCAL WITH D) 500-200 MG-UNIT per tablet Take 1 tablet by mouth daily.      . cholecalciferol (VITAMIN D) 1000 UNITS tablet Take 1,000 Units by mouth daily.     No current facility-administered medications for this visit.    Family History  Problem Relation Age of Onset  . Hypertension Mother   . Osteoporosis Mother     ROS:  Pertinent items are noted in HPI.  Otherwise, a comprehensive  ROS was negative.  Exam:   BP 130/78 mmHg  Pulse 72  Ht 5' 7.25" (1.708 m)  Wt 163 lb (73.936 kg)  BMI 25.34 kg/m2  LMP 02/11/2000 Height: 5' 7.25" (170.8 cm) Ht Readings from Last 3 Encounters:  09/14/14 5' 7.25" (1.708 m)  03/29/14 5' 7.75" (1.721 m)  11/09/13 5' 7.5" (1.715 m)    General appearance: alert, cooperative and appears stated age Head: Normocephalic, without obvious abnormality, atraumatic Neck: no adenopathy, supple, symmetrical, trachea midline and thyroid normal to inspection and palpation Lungs: clear to auscultation bilaterally Breasts: normal appearance, no masses or tenderness Heart: regular rate and rhythm Abdomen: soft, non-tender; no masses,  no organomegaly Extremities: extremities normal, atraumatic, no cyanosis or edema Skin: Skin color, texture, turgor normal. No rashes or lesions Lymph nodes: Cervical, supraclavicular, and axillary nodes normal. No abnormal inguinal nodes palpated Neurologic: Grossly normal   Pelvic: External genitalia:  no lesions              Urethra:  normal appearing urethra with no masses, tenderness or lesions              Bartholin's and Skene's: normal                 Vagina: normal appearing vagina with normal color and discharge, no lesions  Cervix: anteverted              Pap taken: Yes.   Bimanual Exam:  Uterus:  normal size, contour, position, consistency, mobility, non-tender              Adnexa: no mass, fullness, tenderness               Rectovaginal: Confirms               Anus:  normal sphincter tone, no lesions  Chaperone present: no  A:  Well Woman with normal exam  Postmenopausal took HRT 02/2000 - 01/2002  Atrophic vaginitis with dyspareunia  S/P parathyroidectomy 2012  Osteopenia   Vit D deficiency - continue on 2000 IU daily   P:   Reviewed health and wellness pertinent to exam  Pap smear taken today  Mammogram is due 10/16  [facility]  Counseled on breast self exam,  mammography screening, adequate intake of calcium and vitamin D, diet and exercise, Kegel's exercises return annually or prn  An After Visit Summary was printed and given to the patient.

## 2014-09-14 NOTE — Patient Instructions (Addendum)

## 2014-09-16 LAB — IPS PAP SMEAR ONLY

## 2014-09-19 NOTE — Progress Notes (Signed)
Encounter reviewed by Dr. Arieonna Medine Silva.  

## 2014-11-15 ENCOUNTER — Ambulatory Visit (INDEPENDENT_AMBULATORY_CARE_PROVIDER_SITE_OTHER): Payer: Medicare Other | Admitting: Internal Medicine

## 2014-11-15 ENCOUNTER — Encounter: Payer: Self-pay | Admitting: Internal Medicine

## 2014-11-15 VITALS — BP 126/68 | HR 78 | Temp 97.8°F | Wt 164.5 lb

## 2014-11-15 DIAGNOSIS — M25561 Pain in right knee: Secondary | ICD-10-CM

## 2014-11-15 NOTE — Progress Notes (Signed)
   Subjective:    Patient ID: Valerie Sanford, female    DOB: 25-Dec-1945, 69 y.o.   MRN: [id number]  HPI 69 year old Female having problems with right knee for several months. It bothers her particularly when [place]'s walking [place]. At one point [place] who performed an x-ray and told her she had osteoarthritis. 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 Has noticed some swelling of the right knee.    Review of Systems     Objective:   Physical Exam She has decreased flexion and extension of right knee. She has some puffiness in the peripatellar area.  Patella is not ballotable. She has some mild joint line tenderness. She has some crepitus with flexion and extension. Joint is not red or hot.       Assessment & Plan:  Right knee pain  Plan: Patient needs to see orthopedist for further evaluation. Take Aleve or Advil for pain. Has been taking Tylenol which doesn't have anti-inflammatory properties.

## 2014-11-15 NOTE — Patient Instructions (Signed)
See orthopedist for further evaluation of knee pain.

## 2014-11-17 ENCOUNTER — Telehealth: Payer: Self-pay | Admitting: Internal Medicine

## 2014-11-17 NOTE — Telephone Encounter (Signed)
Spoke with patient and provided the following information:  Appointment made with Dr. Norlene CampbellPeter Whitfield [facility] for R Knee Pain  Dr Norlene CampbellPeter Whitfield [address], [address] [place] [id number] [phone] Fax [phone]  Faxed info over to [phone] (on 4/6 @ 1400)

## 2014-12-08 ENCOUNTER — Other Ambulatory Visit: Payer: Self-pay | Admitting: Orthopaedic Surgery

## 2014-12-08 DIAGNOSIS — M25561 Pain in right knee: Secondary | ICD-10-CM

## 2014-12-17 ENCOUNTER — Ambulatory Visit
Admission: RE | Admit: 2014-12-17 | Discharge: 2014-12-17 | Disposition: A | Discharge status: Home / Self Care | Payer: Medicare Other | Source: Ambulatory Visit | Attending: Orthopaedic Surgery | Admitting: Orthopaedic Surgery

## 2014-12-17 DIAGNOSIS — M25561 Pain in right knee: Secondary | ICD-10-CM

## 2015-01-01 HISTORY — PX: MENISCUS REPAIR: SHX5179

## 2015-02-07 ENCOUNTER — Other Ambulatory Visit: Payer: Self-pay

## 2015-04-07 ENCOUNTER — Encounter: Payer: Self-pay | Admitting: Internal Medicine

## 2015-04-07 ENCOUNTER — Other Ambulatory Visit: Payer: Medicare Other | Admitting: Internal Medicine

## 2015-04-07 ENCOUNTER — Ambulatory Visit (INDEPENDENT_AMBULATORY_CARE_PROVIDER_SITE_OTHER): Payer: Medicare Other | Admitting: Internal Medicine

## 2015-04-07 VITALS — BP 108/64 | HR 88 | Temp 98.0°F | Ht 67.0 in | Wt 163.0 lb

## 2015-04-07 DIAGNOSIS — Z9009 Acquired absence of other part of head and neck: Secondary | ICD-10-CM

## 2015-04-07 DIAGNOSIS — Z0000 Encounter for general adult medical examination without abnormal findings: Secondary | ICD-10-CM | POA: Diagnosis not present

## 2015-04-07 DIAGNOSIS — S83241D Other tear of medial meniscus, current injury, right knee, subsequent encounter: Secondary | ICD-10-CM | POA: Diagnosis not present

## 2015-04-07 DIAGNOSIS — M8580 Other specified disorders of bone density and structure, unspecified site: Secondary | ICD-10-CM | POA: Diagnosis not present

## 2015-04-07 DIAGNOSIS — Z9889 Other specified postprocedural states: Secondary | ICD-10-CM | POA: Diagnosis not present

## 2015-04-07 DIAGNOSIS — Z13220 Encounter for screening for lipoid disorders: Secondary | ICD-10-CM

## 2015-04-07 DIAGNOSIS — Z136 Encounter for screening for cardiovascular disorders: Secondary | ICD-10-CM

## 2015-04-07 DIAGNOSIS — E559 Vitamin D deficiency, unspecified: Secondary | ICD-10-CM | POA: Diagnosis not present

## 2015-04-07 DIAGNOSIS — E892 Postprocedural hypoparathyroidism: Secondary | ICD-10-CM

## 2015-04-07 DIAGNOSIS — E785 Hyperlipidemia, unspecified: Secondary | ICD-10-CM

## 2015-04-07 DIAGNOSIS — R5383 Other fatigue: Secondary | ICD-10-CM

## 2015-04-07 LAB — CBC WITH DIFFERENTIAL/PLATELET
Basophils Absolute: 0 10*3/uL (ref 0.0–0.1)
Basophils Relative: 0 % (ref 0–1)
EOS ABS: 0.4 10*3/uL (ref 0.0–0.7)
EOS PCT: 7 % — AB (ref 0–5)
HCT: 39.3 % (ref 36.0–46.0)
Hemoglobin: 12.9 g/dL (ref 12.0–15.0)
LYMPHS PCT: 26 % (ref 12–46)
Lymphs Abs: 1.4 10*3/uL (ref 0.7–4.0)
MCH: 29.9 pg (ref 26.0–34.0)
MCHC: 32.8 g/dL (ref 30.0–36.0)
MCV: 91.2 fL (ref 78.0–100.0)
MONO ABS: 0.4 10*3/uL (ref 0.1–1.0)
MPV: 10.2 fL (ref 8.6–12.4)
Monocytes Relative: 7 % (ref 3–12)
Neutro Abs: 3.2 10*3/uL (ref 1.7–7.7)
Neutrophils Relative %: 60 % (ref 43–77)
Platelets: 242 10*3/uL (ref 150–400)
RBC: 4.31 MIL/uL (ref 3.87–5.11)
RDW: 13.5 % (ref 11.5–15.5)
WBC: 5.4 10*3/uL (ref 4.0–10.5)

## 2015-04-07 LAB — POCT URINALYSIS DIPSTICK
Bilirubin, UA: NEGATIVE
Blood, UA: NEGATIVE
GLUCOSE UA: NEGATIVE
KETONES UA: NEGATIVE
LEUKOCYTES UA: NEGATIVE
Nitrite, UA: NEGATIVE
PROTEIN UA: NEGATIVE
Spec Grav, UA: 1.01
Urobilinogen, UA: NEGATIVE
pH, UA: 6

## 2015-04-07 LAB — LIPID PANEL
Cholesterol: 223 mg/dL — ABNORMAL HIGH (ref 125–200)
HDL: 66 mg/dL (ref >=46)
LDL CALC: 140 mg/dL — AB (ref <130)
Total CHOL/HDL Ratio: 3.4 Ratio (ref <=5.0)
Triglycerides: 86 mg/dL (ref <150)
VLDL: 17 mg/dL (ref <30)

## 2015-04-07 LAB — COMPLETE METABOLIC PANEL WITH GFR
ALBUMIN: 4.4 g/dL (ref 3.6–5.1)
ALK PHOS: 66 U/L (ref 33–130)
ALT: 13 U/L (ref 6–29)
AST: 17 U/L (ref 10–35)
BILIRUBIN TOTAL: 0.8 mg/dL (ref 0.2–1.2)
BUN: 11 mg/dL (ref 7–25)
CO2: 24 mmol/L (ref 20–31)
Calcium: 9.4 mg/dL (ref 8.6–10.4)
Chloride: 103 mmol/L (ref 98–110)
Creat: 0.71 mg/dL (ref 0.50–0.99)
GFR, EST NON AFRICAN AMERICAN: 88 mL/min (ref >=60)
GFR, Est African American: >89 mL/min (ref >=60)
Glucose, Bld: 80 mg/dL (ref 65–99)
Potassium: 4.2 mmol/L (ref 3.5–5.3)
SODIUM: 140 mmol/L (ref 135–146)
TOTAL PROTEIN: 6.6 g/dL (ref 6.1–8.1)

## 2015-04-07 LAB — TSH: TSH: 1.171 u[IU]/mL (ref 0.350–4.500)

## 2015-04-08 LAB — VITAMIN D 25 HYDROXY (VIT D DEFICIENCY, FRACTURES): VIT D 25 HYDROXY: 29 ng/mL — AB (ref 30–100)

## 2015-04-12 NOTE — Patient Instructions (Addendum)
Have bone density study in the near future. Take vitamin D daily. You have significant elevation in total cholesterol from 208-223 but that may be due to lack of exercise with recent right medial meniscal tear. Suggest we repeat fasting lipid panel in 6 months allowing time to work on diet exercise.
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
## 2015-05-24 ENCOUNTER — Ambulatory Visit (INDEPENDENT_AMBULATORY_CARE_PROVIDER_SITE_OTHER): Payer: Medicare Other | Admitting: Internal Medicine

## 2015-05-24 ENCOUNTER — Encounter: Payer: Self-pay | Admitting: Internal Medicine

## 2015-05-24 VITALS — Temp 97.9°F

## 2015-05-24 DIAGNOSIS — Z23 Encounter for immunization: Secondary | ICD-10-CM | POA: Diagnosis not present

## 2015-06-22 ENCOUNTER — Telehealth: Payer: Self-pay

## 2015-06-22 DIAGNOSIS — M8580 Other specified disorders of bone density and structure, unspecified site: Secondary | ICD-10-CM

## 2015-06-22 NOTE — Telephone Encounter (Signed)
Orders only

## 2015-07-25 ENCOUNTER — Telehealth: Payer: Self-pay | Admitting: Nurse Practitioner

## 2015-07-25 NOTE — Telephone Encounter (Addendum)
Please let patient know that BMD from 07/14/2015 shows a T Score at the spine of -2.40; right hip neck -1.90; left hip neck -1.30.  Comparison from previous study is a slight decrease at the spine and right hip and no change at left hip from 02/23/2010.  The lowest score at the spine shows borderline Osteoporosis.  The FRAX score for major fracture in 10 years is 11% (goal is < 20%),  The FRAX score for hip fracture in 10 years is 1.8% (goal is < 3%).  While some bone loss is normal and expectant we do not want to see further loss.  She must maintain good calcium and Vit D support.  She must do upper body weights even if only biceps curls to avoid any neck pain.  She must do chair exercise.  Also must continue walking.   She is also a pt. of Dr. Lenord FellersBaxley and may get notes from her as well.

## 2015-09-01 NOTE — Telephone Encounter (Signed)
Pt notified of results.  Calcium and Vit D doses discussed.  Pt has AEX on 09/19/15 and will discuss more with Shirlyn Goltz, FNP at that time.

## 2015-09-19 ENCOUNTER — Encounter: Payer: Self-pay | Admitting: Nurse Practitioner

## 2015-09-19 ENCOUNTER — Ambulatory Visit (INDEPENDENT_AMBULATORY_CARE_PROVIDER_SITE_OTHER): Payer: Medicare Other | Admitting: Nurse Practitioner

## 2015-09-19 VITALS — BP 124/68 | HR 56 | Ht 68.0 in | Wt 168.0 lb

## 2015-09-19 DIAGNOSIS — Z01419 Encounter for gynecological examination (general) (routine) without abnormal findings: Secondary | ICD-10-CM

## 2015-09-19 DIAGNOSIS — E559 Vitamin D deficiency, unspecified: Secondary | ICD-10-CM | POA: Diagnosis not present

## 2015-09-19 DIAGNOSIS — Z0000 Encounter for general adult medical examination without abnormal findings: Secondary | ICD-10-CM

## 2015-09-19 NOTE — Progress Notes (Signed)
Encounter reviewed by Dr. Tarahji Ramthun Amundson C. Silva.  

## 2015-09-19 NOTE — Progress Notes (Signed)
Patient ID: Valerie Sanford, female   DOB: 02/27/1946, 70 y.o.   MRN: [id number] 70 y.o. G2P2002 Married  Caucasian Fe here for annual exam.  Seasonal allergies have been bad for past 6 months.  Occasional SA and using lubrication.  Patient's last menstrual period was 02/11/2000 (approximate).          Sexually active: Yes.    The current method of family planning is post menopausal status.    Exercising: Yes.    walking, starting weight bearing Smoker:  no  Health Maintenance: Pap: 09/15/14, Negative MMG: 07/14/15, 3D, Bi-Rads 1: Negative Colonoscopy:  2015, normal, repeat in 10 years BMD: 07/14/15,  T Score -2.40 Spine / -1.90 Right Femur Neck / -1.30 Left Femur Neck TDaP: 11/25/09 Shingles: 01/31/15 Pneumonia: 01/03/12, 05/24/15 (Prevnar 13) Hep C : will do today Labs: Dr. Lenord Fellers in 03/2015, will continue at her office   reports that she has never smoked. She has never used smokeless tobacco. She reports that she drinks alcohol. She reports that she does not use illicit drugs.  Past Medical History  Diagnosis Date  . Arthritis     Neck/Hands- Not Rheumatoid    Past Surgical History  Procedure Laterality Date  . Parathyroidectomy  2012  . Meniscus repair  01/01/15    Current Outpatient Prescriptions  Medication Sig Dispense Refill  . cholecalciferol (VITAMIN D) 1000 UNITS tablet Take 1,000 Units by mouth daily.     No current facility-administered medications for this visit.    Family History  Problem Relation Age of Onset  . Hypertension Mother   . Osteoporosis Mother     ROS:  Pertinent items are noted in HPI.  Otherwise, a comprehensive ROS was negative.  Exam:   BP 124/68 mmHg  Pulse 56  Ht  (1.727 m)  Wt 168 lb (76.204 kg)  BMI 25.55 kg/m2  LMP 02/11/2000 (Approximate) Height:  (172.7 cm) (with shoes, prefers not to remove) Ht Readings from Last 3 Encounters:  09/19/15  (1.727 m)  04/07/15  (1.702 m)  09/14/14 5' 7.25" (1.708 m)     General appearance: alert, cooperative and appears stated age Head: Normocephalic, without obvious abnormality, atraumatic Neck: no adenopathy, supple, symmetrical, trachea midline and thyroid normal to inspection and palpation Lungs: clear to auscultation bilaterally Breasts: normal appearance, no masses or tenderness Heart: regular rate and rhythm Abdomen: soft, non-tender; no masses,  no organomegaly Extremities: extremities normal, atraumatic, no cyanosis or edema Skin: Skin color, texture, turgor normal. No rashes or lesions Lymph nodes: Cervical, supraclavicular, and axillary nodes normal. No abnormal inguinal nodes palpated Neurologic: Grossly normal   Pelvic: External genitalia:  no lesions              Urethra:  normal appearing urethra with no masses, tenderness or lesions              Bartholin's and Skene's: normal                 Vagina: normal appearing vagina with normal color and discharge, no lesions              Cervix: anteverted              Pap taken: No. Bimanual Exam:  Uterus:  normal size, contour, position, consistency, mobility, non-tender              Adnexa: no mass, fullness, tenderness  Rectovaginal: Confirms               Anus:  normal sphincter tone, no lesions  Chaperone present: no  A:  Well Woman with normal exam  Postmenopausal took HRT 02/2000 - 01/2002 Atrophic vaginitis with dyspareunia S/P parathyroidectomy 2012 Osteopenia  Vit D deficiency - continues on 2000 IU daily  P:   Reviewed health and wellness pertinent to exam  Pap smear as above  Mammogram is due 12/17  Will follow with labs, currently on OTC Vit D  Counseled on breast self exam, mammography screening, osteoporosis, adequate intake of calcium and vitamin D, diet and exercise, Kegel's exercises return annually or prn  An After Visit Summary was printed and given to the patient.   Reviewed BMD results with  her today.

## 2015-09-19 NOTE — Patient Instructions (Signed)

## 2015-09-20 DIAGNOSIS — H2513 Age-related nuclear cataract, bilateral: Secondary | ICD-10-CM | POA: Diagnosis not present

## 2015-09-20 LAB — VITAMIN D 25 HYDROXY (VIT D DEFICIENCY, FRACTURES): Vit D, 25-Hydroxy: 38 ng/mL (ref 30–100)

## 2015-09-20 LAB — HEPATITIS C ANTIBODY: HCV Ab: NEGATIVE

## 2015-09-26 DIAGNOSIS — H1045 Other chronic allergic conjunctivitis: Secondary | ICD-10-CM | POA: Diagnosis not present

## 2015-10-11 DIAGNOSIS — H2513 Age-related nuclear cataract, bilateral: Secondary | ICD-10-CM | POA: Diagnosis not present

## 2015-10-12 ENCOUNTER — Telehealth: Payer: Self-pay | Admitting: Internal Medicine

## 2015-10-12 DIAGNOSIS — Z029 Encounter for administrative examinations, unspecified: Secondary | ICD-10-CM

## 2015-10-12 NOTE — Telephone Encounter (Signed)
Patient requesting a letter of surgical clearance be sent to Dr. Arville Care at [facility] at [facility] for Cataract Surgery on 10/24/15 at [facility].  Patient had CPE on 04/07/15.    Letter sent to:  [facility] [place] Attn:  Arville Care, M.D. [address] [facility], [address] [place], [place].  [id number] [phone] Surgery Date:  10/24/15  Charge:  $20 (patient notified)

## 2015-10-19 ENCOUNTER — Ambulatory Visit (INDEPENDENT_AMBULATORY_CARE_PROVIDER_SITE_OTHER): Payer: Medicare Other | Admitting: Internal Medicine

## 2015-10-19 ENCOUNTER — Encounter: Payer: Self-pay | Admitting: Internal Medicine

## 2015-10-19 VITALS — BP 128/78 | HR 75 | Temp 97.8°F | Resp 20 | Ht 68.0 in | Wt 170.0 lb

## 2015-10-19 DIAGNOSIS — K219 Gastro-esophageal reflux disease without esophagitis: Secondary | ICD-10-CM

## 2015-10-19 DIAGNOSIS — J309 Allergic rhinitis, unspecified: Secondary | ICD-10-CM

## 2015-10-19 DIAGNOSIS — Z01818 Encounter for other preprocedural examination: Secondary | ICD-10-CM | POA: Diagnosis not present

## 2015-10-19 NOTE — Progress Notes (Signed)
   Subjective:    Patient ID: Valerie Sanford, female    DOB: 07-08-1946, 70 y.o.   MRN: [id number]  HPI 70 year old Female seen yearly for annual physical exam here Gwendolyn Filltoday for surgical clearance for left cataract extraction this Monday, March 13 at [facility] in [place]. Patient says she's been having some issues with allergic rhinitis. Have recommended Zyrtec and Flonase. Mother's health is not been good recently. This is taking up a lot of her time. 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 Recently had GYN exam by Rock NephewPatti Grubb [place] in [place]. Weight in August 2016 was 163 pounds and is now 170 pounds. Continues with vitamin D supplement. Immunizations are up-to-date. History of osteopenia and hyperlipidemia. Hyperlipidemia is controlled by diet. History of parathyroidectomy November 2011 for hyperparathyroidism. History of medial meniscal tear 2016 by Dr. Cleophas DunkerWhitfield.  Nonsmoker. Social alcohol consumption.  Had colonoscopy in 2006.  Married with 2 children in good health.    Review of Systems aside from allergic rhinitis.Some issues with GE reflux treated with Tums. Recommended Zantac 150 mg twice daily     Objective:   Physical Exam  Constitutional: She appears well-developed and well-nourished.  HENT:  Head: Normocephalic and atraumatic.  Mouth/Throat: Oropharynx is clear and moist.  Right TM obscured by cerumen  Eyes: No scleral icterus.  Neck: Neck supple. No JVD present. No thyromegaly present.  No bruits  Cardiovascular: Normal rate, regular rhythm and normal heart sounds.   No murmur heard. Pulmonary/Chest: Effort normal and breath sounds normal. No respiratory distress. She has no wheezes. She has no rales.  Abdominal: Soft. Bowel sounds are normal. She exhibits no distension and no mass. There is no tenderness. There is no rebound and no guarding.  Musculoskeletal:  Trace nonpitting or extremity edema  Skin:  Skin is warm and dry.  Psychiatric: She has a normal mood and affect. Her behavior is normal. Judgment and thought content normal.  Vitals reviewed.         Assessment & Plan:  Normal exam for preoperative clearance for cataract extraction  Allergic rhinitis-recommend Flonase nasal spray and Zyrtec  GE reflux recommend Zantac 150 mg twice daily. She has had of bed elevated. Avoid eating late at night.  Plan: Form completed as requested by [facility]. See above recommendations. Return August 2017 for annual physical exam.

## 2015-10-19 NOTE — Patient Instructions (Signed)
Form completed for cataract extraction Monday, March 13 at [facility] in [place]. Recommend Zyrtec and Flonase nasal spray for allergic rhinitis

## 2015-10-24 DIAGNOSIS — H2512 Age-related nuclear cataract, left eye: Secondary | ICD-10-CM | POA: Diagnosis not present

## 2015-10-24 HISTORY — PX: CATARACT EXTRACTION W/ INTRAOCULAR LENS IMPLANT: SHX1309

## 2015-11-07 DIAGNOSIS — H2511 Age-related nuclear cataract, right eye: Secondary | ICD-10-CM | POA: Diagnosis not present

## 2015-11-07 HISTORY — PX: CATARACT EXTRACTION W/ INTRAOCULAR LENS IMPLANT: SHX1309

## 2016-04-09 ENCOUNTER — Encounter: Payer: Self-pay | Admitting: Internal Medicine

## 2016-04-09 ENCOUNTER — Other Ambulatory Visit: Payer: Medicare Other | Admitting: Internal Medicine

## 2016-05-07 ENCOUNTER — Ambulatory Visit (INDEPENDENT_AMBULATORY_CARE_PROVIDER_SITE_OTHER): Payer: Medicare Other | Admitting: Internal Medicine

## 2016-05-07 ENCOUNTER — Other Ambulatory Visit: Payer: Medicare Other | Admitting: Internal Medicine

## 2016-05-07 ENCOUNTER — Encounter: Payer: Self-pay | Admitting: Internal Medicine

## 2016-05-07 ENCOUNTER — Telehealth: Payer: Self-pay | Admitting: Internal Medicine

## 2016-05-07 VITALS — BP 128/84 | HR 60 | Temp 97.8°F | Ht 67.0 in | Wt 166.0 lb

## 2016-05-07 DIAGNOSIS — M8580 Other specified disorders of bone density and structure, unspecified site: Secondary | ICD-10-CM

## 2016-05-07 DIAGNOSIS — E785 Hyperlipidemia, unspecified: Secondary | ICD-10-CM | POA: Diagnosis not present

## 2016-05-07 DIAGNOSIS — E892 Postprocedural hypoparathyroidism: Secondary | ICD-10-CM | POA: Diagnosis not present

## 2016-05-07 DIAGNOSIS — Z8639 Personal history of other endocrine, nutritional and metabolic disease: Secondary | ICD-10-CM | POA: Diagnosis not present

## 2016-05-07 DIAGNOSIS — J309 Allergic rhinitis, unspecified: Secondary | ICD-10-CM

## 2016-05-07 DIAGNOSIS — Z0000 Encounter for general adult medical examination without abnormal findings: Secondary | ICD-10-CM

## 2016-05-07 DIAGNOSIS — R319 Hematuria, unspecified: Secondary | ICD-10-CM

## 2016-05-07 DIAGNOSIS — K219 Gastro-esophageal reflux disease without esophagitis: Secondary | ICD-10-CM

## 2016-05-07 DIAGNOSIS — M25512 Pain in left shoulder: Secondary | ICD-10-CM | POA: Diagnosis not present

## 2016-05-07 DIAGNOSIS — Z23 Encounter for immunization: Secondary | ICD-10-CM | POA: Diagnosis not present

## 2016-05-07 DIAGNOSIS — E559 Vitamin D deficiency, unspecified: Secondary | ICD-10-CM | POA: Diagnosis not present

## 2016-05-07 DIAGNOSIS — E213 Hyperparathyroidism, unspecified: Secondary | ICD-10-CM | POA: Diagnosis not present

## 2016-05-07 DIAGNOSIS — Z9009 Acquired absence of other part of head and neck: Secondary | ICD-10-CM

## 2016-05-07 LAB — POCT URINALYSIS DIPSTICK
BILIRUBIN UA: NEGATIVE
GLUCOSE UA: NEGATIVE
KETONES UA: NEGATIVE
NITRITE UA: NEGATIVE
Protein, UA: NEGATIVE
Spec Grav, UA: <=1.005
Urobilinogen, UA: NEGATIVE
pH, UA: 8

## 2016-05-07 LAB — CBC WITH DIFFERENTIAL/PLATELET
BASOS PCT: 0 %
Basophils Absolute: 0 cells/uL (ref 0–200)
EOS ABS: 270 {cells}/uL (ref 15–500)
EOS PCT: 6 %
HEMATOCRIT: 38.2 % (ref 35.0–45.0)
Hemoglobin: 12.8 g/dL (ref 11.7–15.5)
LYMPHS ABS: 1305 {cells}/uL (ref 850–3900)
LYMPHS PCT: 29 %
MCH: 30.3 pg (ref 27.0–33.0)
MCHC: 33.5 g/dL (ref 32.0–36.0)
MCV: 90.3 fL (ref 80.0–100.0)
MONO ABS: 360 {cells}/uL (ref 200–950)
MPV: 10.5 fL (ref 7.5–12.5)
Monocytes Relative: 8 %
NEUTROS ABS: 2565 {cells}/uL (ref 1500–7800)
Neutrophils Relative %: 57 %
PLATELETS: 231 10*3/uL (ref 140–400)
RBC: 4.23 MIL/uL (ref 3.80–5.10)
RDW: 13.1 % (ref 11.0–15.0)
WBC: 4.5 10*3/uL (ref 3.8–10.8)

## 2016-05-07 LAB — LIPID PANEL
CHOLESTEROL: 190 mg/dL (ref 125–200)
HDL: 60 mg/dL (ref >=46)
LDL Cholesterol: 114 mg/dL (ref <130)
Total CHOL/HDL Ratio: 3.2 Ratio (ref <=5.0)
Triglycerides: 80 mg/dL (ref <150)
VLDL: 16 mg/dL (ref <30)

## 2016-05-07 LAB — COMPREHENSIVE METABOLIC PANEL
ALT: 11 U/L (ref 6–29)
AST: 15 U/L (ref 10–35)
Albumin: 4.3 g/dL (ref 3.6–5.1)
Alkaline Phosphatase: 55 U/L (ref 33–130)
BILIRUBIN TOTAL: 0.5 mg/dL (ref 0.2–1.2)
BUN: 9 mg/dL (ref 7–25)
CALCIUM: 9.4 mg/dL (ref 8.6–10.4)
CO2: 27 mmol/L (ref 20–31)
Chloride: 105 mmol/L (ref 98–110)
Creat: 0.7 mg/dL (ref 0.50–0.99)
GLUCOSE: 90 mg/dL (ref 65–99)
Potassium: 4.4 mmol/L (ref 3.5–5.3)
SODIUM: 140 mmol/L (ref 135–146)
Total Protein: 6.3 g/dL (ref 6.1–8.1)

## 2016-05-07 NOTE — Telephone Encounter (Signed)
Consult with Dr. Norlene CampbellPeter Whitfield @ [facility].  Dx:  Left shoulder Arthropathy x 2 weeks.    Spoke with Melissa @ [phone].  Patient is already established there.  Appointment given for Monday, 10/2 @ 3:15.  Patient just needs to bring insurance cards and list of medications.  [facility] need [facility].    Spoke with patient @ [phone]; advised of appointment date/time.  Patient is aware of address since she's already patient at this Practice.
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
## 2016-05-07 NOTE — Patient Instructions (Addendum)
Appointment with Dr. Norlene CampbellPeter Whitfield next week regarding left shoulder pain. Continue same medications. Labs are pending. Urine culture pending

## 2016-05-07 NOTE — Progress Notes (Signed)
   Subjective:    Patient ID: Valerie FillLinda M Sanford, female    DOB: 1946-06-06, 70 y.o.   MRN: [id number]  HPI    Review of Systems     Objective:   Physical Exam        Assessment & Plan:

## 2016-05-08 LAB — VITAMIN D 25 HYDROXY (VIT D DEFICIENCY, FRACTURES): VIT D 25 HYDROXY: 38 ng/mL (ref 30–100)

## 2016-05-08 LAB — URINE CULTURE: Organism ID, Bacteria: NO GROWTH

## 2016-05-08 LAB — TSH: TSH: 1.29 mIU/L

## 2016-05-10 DIAGNOSIS — M7542 Impingement syndrome of left shoulder: Secondary | ICD-10-CM | POA: Diagnosis not present

## 2016-05-10 DIAGNOSIS — M7502 Adhesive capsulitis of left shoulder: Secondary | ICD-10-CM | POA: Diagnosis not present

## 2016-05-14 ENCOUNTER — Ambulatory Visit (INDEPENDENT_AMBULATORY_CARE_PROVIDER_SITE_OTHER): Payer: Self-pay | Admitting: Orthopaedic Surgery

## 2016-05-15 DIAGNOSIS — Z23 Encounter for immunization: Secondary | ICD-10-CM | POA: Diagnosis not present

## 2016-06-11 ENCOUNTER — Telehealth: Payer: Self-pay | Admitting: Internal Medicine

## 2016-06-11 NOTE — Telephone Encounter (Signed)
Patient called; states that she has received a bill from [facility] r/t Vitamin D and TSH.  Advised patient that we do not do any billing for [facility].  States that she doesn't know what to do to get this paid for.  Looked on patient's chart and called [facility].  Spoke with Clydie BraunKaren @ [facility] [phone] in Billing; advised that patient had received a bill regarding her labs and that we needed to provide updated ICD10 diagnoses codes; provided TSH dx of Z86.39.  Gave E55.9 and M85.80.  Per Welford RocheKaren, [facility]; allow 30-45 days for the insurance to be re-filed to [employer] and [employer].    Patient states she doesn't want to have these drawn any more since insurance doesn't pay.  Advised that she can have these drawn one time per year.

## 2016-08-18 DIAGNOSIS — Z1231 Encounter for screening mammogram for malignant neoplasm of breast: Secondary | ICD-10-CM | POA: Diagnosis not present

## 2016-09-21 ENCOUNTER — Encounter: Payer: Self-pay | Admitting: Nurse Practitioner

## 2016-09-21 NOTE — Progress Notes (Deleted)
Patient ID: Valerie Sanford, female   DOB: 04-06-1946, 71 y.o.   MRN: [id number]  71 y.o. G2P2002 Married  Caucasian Fe here for annual exam.    Patient's last menstrual period was 02/11/2000 (approximate).          Sexually active: {yes no:314532}  The current method of family planning is {contraception:315051}.    Exercising: {yes no:314532}  {types:19826} Smoker:  {YES [id number]  Health Maintenance: Pap: 09/15/14, Negative, no other history available MMG: 08/18/16 *** Colonoscopy: 03/11/14, normal, repeat in 10 years (Care Everywhere) BMD: 07/14/15, T Score -2.40 Spine / -1.90 Right Femur Neck / -1.30 Left Femur Neck TDaP: 11/25/09 Shingles: 01/31/15 Pneumonia: 01/03/12, 05/24/15 (Prevnar 13) Hep C: 09/19/15 Labs: *** Vit D   reports that she has never smoked. She has never used smokeless tobacco. She reports that she drinks alcohol. She reports that she does not use drugs.  Past Medical History:  Diagnosis Date  . Arthritis    Neck/Hands- Not Rheumatoid    Past Surgical History:  Procedure Laterality Date  . CATARACT EXTRACTION W/ INTRAOCULAR LENS IMPLANT Left 10/24/2015   Care Everywhere  . CATARACT EXTRACTION W/ INTRAOCULAR LENS IMPLANT Right 11/07/2015   Care Everywhere  . MENISCUS REPAIR  01/01/15  . PARATHYROIDECTOMY  2012    Current Outpatient Prescriptions  Medication Sig Dispense Refill  . cholecalciferol (VITAMIN D) 1000 UNITS tablet Take 1,000 Units by mouth daily.     No current facility-administered medications for this visit.     Family History  Problem Relation Age of Onset  . Hypertension Mother   . Osteoporosis Mother     ROS:  Pertinent items are noted in HPI.  Otherwise, a comprehensive ROS was negative.  Exam:   LMP 02/11/2000 (Approximate)    Ht Readings from Last 3 Encounters:  05/07/16 5\' 7"  (1.702 m)  10/19/15 5\' 8"  (1.727 m)  09/19/15 5\' 8"  (1.727 m)    General appearance: alert, cooperative and appears stated age Head: Normocephalic,  without obvious abnormality, atraumatic Neck: no adenopathy, supple, symmetrical, trachea midline and thyroid {EXAM; THYROID:18604} Lungs: clear to auscultation bilaterally Breasts: {Exam; breast:13139::"normal appearance, no masses or tenderness"} Heart: regular rate and rhythm Abdomen: soft, non-tender; no masses,  no organomegaly Extremities: extremities normal, atraumatic, no cyanosis or edema Skin: Skin color, texture, turgor normal. No rashes or lesions Lymph nodes: Cervical, supraclavicular, and axillary nodes normal. No abnormal inguinal nodes palpated Neurologic: Grossly normal   Pelvic: External genitalia:  no lesions              Urethra:  normal appearing urethra with no masses, tenderness or lesions              Bartholin's and Skene's: normal                 Vagina: normal appearing vagina with normal color and discharge, no lesions              Cervix: {exam; cervix:14595}              Pap taken: {yes no:314532} Bimanual Exam:  Uterus:  {exam; uterus:12215}              Adnexa: {exam; adnexa:12223}               Rectovaginal: Confirms               Anus:  normal sphincter tone, no lesions  Chaperone present: ***  A:  Well Woman with normal exam  P:   Reviewed health and wellness pertinent to exam  Pap smear as above  {plan; gyn:5269::"mammogram","pap smear","return annually or prn"}  An After Visit Summary was printed and given to the patient.

## 2016-09-24 ENCOUNTER — Telehealth: Payer: Self-pay | Admitting: Nurse Practitioner

## 2016-09-24 ENCOUNTER — Ambulatory Visit: Payer: Self-pay | Admitting: Nurse Practitioner

## 2016-09-24 NOTE — Telephone Encounter (Signed)
Appointment canceled for today needs prior authorization from [facility]

## 2016-10-08 ENCOUNTER — Ambulatory Visit (INDEPENDENT_AMBULATORY_CARE_PROVIDER_SITE_OTHER): Payer: Medicare Other | Admitting: Nurse Practitioner

## 2016-10-08 ENCOUNTER — Encounter: Payer: Self-pay | Admitting: Nurse Practitioner

## 2016-10-08 ENCOUNTER — Ambulatory Visit: Payer: Self-pay | Admitting: Nurse Practitioner

## 2016-10-08 VITALS — BP 108/64 | HR 64 | Ht 67.0 in | Wt 167.0 lb

## 2016-10-08 DIAGNOSIS — Z01411 Encounter for gynecological examination (general) (routine) with abnormal findings: Secondary | ICD-10-CM | POA: Diagnosis not present

## 2016-10-08 DIAGNOSIS — E559 Vitamin D deficiency, unspecified: Secondary | ICD-10-CM | POA: Diagnosis not present

## 2016-10-08 DIAGNOSIS — Z0000 Encounter for general adult medical examination without abnormal findings: Secondary | ICD-10-CM

## 2016-10-08 NOTE — Patient Instructions (Signed)

## 2016-10-08 NOTE — Progress Notes (Signed)
Encounter reviewed by Dr. Brook Amundson C. Silva.  

## 2016-10-08 NOTE — Progress Notes (Signed)
71 y.o. G37P2002 Married  Caucasian Fe here for annual exam. Feels well other than left shoulder injury while moving furniture.  Has seen Ortho and had  Cortisone injection ~ 6 months.  Patient's last menstrual period was 02/11/2000 (approximate).          Sexually active: Yes.    The current method of family planning is post menopausal status.    Exercising: Yes.    walking Smoker:  no  Health Maintenance: Pap: 09/15/14, Negative  07/24/11, Negative with neg HR HPV MMG: 08/18/16, 3D, Bi-Rads 1: Negative Colonoscopy: 03/11/14, normal, repeat in 10 years (Care Everywhere) BMD: 07/14/15,  T Score -2.40 Spine / -1.90 Right Femur Neck / -1.30 Left Femur Neck TDaP: 11/25/09 Shingles: 01/31/15 Pneumonia: 01/03/12, 05/24/15 (Prevnar 13) Hep C: 09/19/15 Labs: PCP takes care of all labs in EPIC   reports that she has never smoked. She has never used smokeless tobacco. She reports that she drinks alcohol. She reports that she does not use drugs.  Past Medical History:  Diagnosis Date  . Arthritis    Neck/Hands- Not Rheumatoid    Past Surgical History:  Procedure Laterality Date  . CATARACT EXTRACTION W/ INTRAOCULAR LENS IMPLANT Left 10/24/2015   Care Everywhere  . CATARACT EXTRACTION W/ INTRAOCULAR LENS IMPLANT Right 11/07/2015   Care Everywhere  . MENISCUS REPAIR  01/01/15  . PARATHYROIDECTOMY  2012    Current Outpatient Prescriptions  Medication Sig Dispense Refill  . cholecalciferol (VITAMIN D) 1000 UNITS tablet Take 1,000 Units by mouth daily.     No current facility-administered medications for this visit.     Family History  Problem Relation Age of Onset  . Hypertension Mother   . Osteoporosis Mother     ROS:  Pertinent items are noted in HPI.  Otherwise, a comprehensive ROS was negative.  Exam:   LMP 02/11/2000 (Approximate)    Ht Readings from Last 3 Encounters:  05/07/16 5\' 7"  (1.702 m)  10/19/15 5\' 8"  (1.727 m)  09/19/15 5\' 8"  (1.727 m)    General appearance: alert,  cooperative and appears stated age Head: Normocephalic, without obvious abnormality, atraumatic Neck: no adenopathy, supple, symmetrical, trachea midline and thyroid normal to inspection and palpation Lungs: clear to auscultation bilaterally Breasts: normal appearance, no masses or tenderness Heart: regular rate and rhythm Abdomen: soft, non-tender; no masses,  no organomegaly Extremities: extremities normal, atraumatic, no cyanosis or edema, right shoulder decreased ROM and crepitations. Skin: Skin color, texture, turgor normal. No rashes or lesions Lymph nodes: Cervical, supraclavicular, and axillary nodes normal. No abnormal inguinal nodes palpated Neurologic: Grossly normal   Pelvic: External genitalia:  no lesions              Urethra:  normal appearing urethra with no masses, tenderness or lesions              Bartholin's and Skene's: normal                 Vagina: normal appearing vagina with normal color and discharge, no lesions              Cervix: anteverted              Pap taken: No. Bimanual Exam:  Uterus:  normal size, contour, position, consistency, mobility, non-tender              Adnexa: no mass, fullness, tenderness               Rectovaginal: Confirms  Anus:  normal sphincter tone, no lesions  Chaperone present: yes  A:  Well Woman with normal exam  Postmenopausal took HRT 02/2000 - 01/2002 Atrophic vaginitis with dyspareunia S/P parathyroidectomy 2012 Osteopenia of spine and hips  Injury left shoulder with decrease ROM Vit D deficiency - continues on 1000 IU daily   P:   Reviewed health and wellness pertinent to exam  Pap smear not done  Mammogram is due 08/2017 and [facility] at [facility].  She will also take OTC Vit D 2000 IU in the winter and 1000 IU in the summer months.  Strongly advise pt to return to Ortho for further eval on left shoulder - states she will go by  there today and get done.  Counseled on breast self exam, mammography screening, adequate intake of calcium and vitamin D, diet and exercise, Kegel's exercises return annually or prn  An After Visit Summary was printed and given to the patient.

## 2016-10-10 ENCOUNTER — Ambulatory Visit (INDEPENDENT_AMBULATORY_CARE_PROVIDER_SITE_OTHER): Payer: Medicare Other | Admitting: Orthopedic Surgery

## 2016-10-10 ENCOUNTER — Encounter (INDEPENDENT_AMBULATORY_CARE_PROVIDER_SITE_OTHER): Payer: Self-pay | Admitting: Orthopedic Surgery

## 2016-10-10 VITALS — BP 136/72 | HR 67 | Resp 12 | Ht 68.0 in | Wt 167.0 lb

## 2016-10-10 DIAGNOSIS — M25512 Pain in left shoulder: Secondary | ICD-10-CM | POA: Diagnosis not present

## 2016-10-10 DIAGNOSIS — G8929 Other chronic pain: Secondary | ICD-10-CM

## 2016-10-10 NOTE — Progress Notes (Signed)
Thanks

## 2016-10-10 NOTE — Progress Notes (Signed)
Office Visit Note   Patient: Valerie Sanford M Ridener           Date of Birth: 1945-12-28           MRN: [id number] Visit Date: 10/10/2016              Requested by: Margaree MackintoshMary J Baxley, MD [address] [place], [place] [id number] PCP: Margaree MackintoshMary J Baxley, MD   Assessment & Plan: Visit Diagnoses:  1. Chronic left shoulder pain     Plan:  #1: MRI scan to rule out rotator cuff tear #2: Probably will need at least a manipulation of the left shoulder and possibly that of an NSAID and DCR if there is no tear  Follow-Up Instructions: Return in about 2 weeks (around 10/24/2016) for review of mri.   Orders:  Orders Placed This Encounter  Procedures  . MR Shoulder Left w/o contrast   No orders of the defined types were placed in this encounter.     Procedures: No procedures performed   Clinical Data: No additional findings.   Subjective: Chief Complaint  Patient presents with  . Left Shoulder - Follow-up    Left shoulder pain. Given cortisone injection on 05/10/16. Patient states she was moving furniture and believes this may be what has trigger the pain in her shoulder. No surgery to shoulder. Patient is not diabetic.   Valerie QuinLinda returns today for evaluation of her left shoulder up. I had seen her back in the nerve and gave her a corticosteroid injection. At that time she also has some early arthrofibrosis. She returns today with the very similar pain and discomfort mainly in the shoulder radiating down to the elbow but no further. She has also noted that she does not have the range of motion anymore either. She denies any cervical spine pain. Seen today for evaluation.    Review of Systems  Constitutional: Negative.   HENT: Negative.   Respiratory: Negative.   Cardiovascular: Negative.   Gastrointestinal: Negative.   Genitourinary: Negative.   Skin: Negative.   Neurological: Negative.   Hematological: Negative.   Psychiatric/Behavioral: Negative.      Objective: Vital  Signs: BP 136/72 (BP Location: Left Arm, Patient Position: Sitting, Cuff Size: Normal)   Pulse 67   Resp 12   Ht 5\' 8"  (1.727 m)   Wt 167 lb (75.8 kg)   LMP 02/11/2000 (Approximate)   BMI 25.39 kg/m   Physical Exam  Constitutional: She is oriented to person, place, and time. She appears well-developed and well-nourished.  HENT:  Head: Normocephalic and atraumatic.  Eyes: EOM are normal. Pupils are equal, round, and reactive to light.  Pulmonary/Chest: Effort normal.  Neurological: She is alert and oriented to person, place, and time.  Skin: Skin is warm and dry.  Psychiatric: She has a normal mood and affect. Her behavior is normal. Judgment and thought content normal.    Left Shoulder Exam   Range of Motion  Active Abduction: 70  Passive Abduction: 70  Forward Flexion: 90  External Rotation: 30  Internal Rotation 90 degrees: 30   Muscle Strength  Abduction: 2/5  Internal Rotation: 4/5  External Rotation: 2/5  Subscapularis: 4/5   Tests  Apprehension: positive Impingement: positive      Specialty Comments:  No specialty comments available.  Imaging: No results found.   PMFS History: Patient Active Problem List   Diagnosis Date Noted  . GE reflux 07/08/2012  . Vitamin D deficiency 07/16/2011  . Hyperlipidemia 07/15/2011  .  History of hyperparathyroidism 07/15/2011  . Osteopenia 07/15/2011   Past Medical History:  Diagnosis Date  . Arthritis    Neck/Hands- Not Rheumatoid  . Osteopenia     Family History  Problem Relation Age of Onset  . Hypertension Mother   . Osteoporosis Mother   . Heart disease Mother     Past Surgical History:  Procedure Laterality Date  . CATARACT EXTRACTION W/ INTRAOCULAR LENS IMPLANT Left 10/24/2015   Care Everywhere  . CATARACT EXTRACTION W/ INTRAOCULAR LENS IMPLANT Right 11/07/2015   Care Everywhere  . MENISCUS REPAIR  01/01/15  . PARATHYROIDECTOMY  2012   Social History   Occupational History  . Not on file.    Social History Main Topics  . Smoking status: Never Smoker  . Smokeless tobacco: Never Used  . Alcohol use Yes     Comment: socially  . Drug use: No  . Sexual activity: Yes    Birth control/ protection: Post-menopausal

## 2016-10-17 ENCOUNTER — Ambulatory Visit
Admission: RE | Admit: 2016-10-17 | Discharge: 2016-10-17 | Disposition: A | Discharge status: Home / Self Care | Payer: Medicare Other | Source: Ambulatory Visit | Attending: Orthopedic Surgery | Admitting: Orthopedic Surgery

## 2016-10-17 DIAGNOSIS — M25512 Pain in left shoulder: Secondary | ICD-10-CM | POA: Diagnosis not present

## 2016-10-18 DIAGNOSIS — H26493 Other secondary cataract, bilateral: Secondary | ICD-10-CM | POA: Diagnosis not present

## 2016-10-18 DIAGNOSIS — H1045 Other chronic allergic conjunctivitis: Secondary | ICD-10-CM | POA: Diagnosis not present

## 2016-10-18 DIAGNOSIS — H43393 Other vitreous opacities, bilateral: Secondary | ICD-10-CM | POA: Diagnosis not present

## 2016-10-19 ENCOUNTER — Encounter (INDEPENDENT_AMBULATORY_CARE_PROVIDER_SITE_OTHER): Payer: Self-pay | Admitting: Orthopaedic Surgery

## 2016-10-19 ENCOUNTER — Ambulatory Visit (INDEPENDENT_AMBULATORY_CARE_PROVIDER_SITE_OTHER): Payer: Medicare Other | Admitting: Orthopaedic Surgery

## 2016-10-19 VITALS — BP 123/69 | HR 79 | Ht 67.0 in | Wt 165.0 lb

## 2016-10-19 DIAGNOSIS — M25512 Pain in left shoulder: Secondary | ICD-10-CM

## 2016-10-19 DIAGNOSIS — G8929 Other chronic pain: Secondary | ICD-10-CM | POA: Diagnosis not present

## 2016-10-19 NOTE — Progress Notes (Signed)
   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 Patient will follow up as needed pending results of physical therapy Follow-Up Instructions: No Follow-up on file.   Orders:  No orders of the defined types were placed in this encounter.  No orders of the defined types were placed in this encounter.     Procedures: No procedures performed   Clinical Data: No additional findings. MRI scan left shoulder demonstrates a partial articular surface tear of the supraspinatus tendon. Biceps appears to be intact. Glenohumeral joints intact. (See report)  Subjective: Chief Complaint  Patient presents with  . Left Shoulder - Results    Pt here today for MRI results of left shoulder    Review of Systems   Objective: Vital Signs: LMP 02/11/2000 (Approximate)   Physical Exam  Ortho Exam left shoulder with positive impingement extreme of internal and external rotation. Painful arc of overhead motion lacking about 30-35 to full overhead and good strength. Intact. Biceps intact.  Specialty Comments:  No specialty comments available.  Imaging: No results found.   PMFS History: Patient Active Problem List   Diagnosis Date Noted  . GE reflux 07/08/2012  . Vitamin D deficiency 07/16/2011  . Hyperlipidemia 07/15/2011  . History of hyperparathyroidism 07/15/2011  . Osteopenia 07/15/2011   Past Medical History:  Diagnosis Date  . Arthritis    Neck/Hands- Not Rheumatoid  . Osteopenia     Family History  Problem Relation Age of Onset  .  Hypertension Mother   . Osteoporosis Mother   . Heart disease Mother     Past Surgical History:  Procedure Laterality Date  . CATARACT EXTRACTION W/ INTRAOCULAR LENS IMPLANT Left 10/24/2015   Care Everywhere  . CATARACT EXTRACTION W/ INTRAOCULAR LENS IMPLANT Right 11/07/2015   Care Everywhere  . MENISCUS REPAIR  01/01/15  . PARATHYROIDECTOMY  2012   Social History   Occupational History  . Not on file.   Social History Main Topics  . Smoking status: Never Smoker  . Smokeless tobacco: Never Used  . Alcohol use Yes     Comment: socially  . Drug use: No  . Sexual activity: Yes    Birth control/ protection: Post-menopausal

## 2016-10-23 DIAGNOSIS — M6281 Muscle weakness (generalized): Secondary | ICD-10-CM | POA: Diagnosis not present

## 2016-10-23 DIAGNOSIS — M25612 Stiffness of left shoulder, not elsewhere classified: Secondary | ICD-10-CM | POA: Diagnosis not present

## 2016-10-23 DIAGNOSIS — M25512 Pain in left shoulder: Secondary | ICD-10-CM | POA: Diagnosis not present

## 2016-10-23 DIAGNOSIS — M542 Cervicalgia: Secondary | ICD-10-CM | POA: Diagnosis not present

## 2016-11-01 DIAGNOSIS — M25512 Pain in left shoulder: Secondary | ICD-10-CM | POA: Diagnosis not present

## 2016-11-01 DIAGNOSIS — M6281 Muscle weakness (generalized): Secondary | ICD-10-CM | POA: Diagnosis not present

## 2016-11-01 DIAGNOSIS — M25612 Stiffness of left shoulder, not elsewhere classified: Secondary | ICD-10-CM | POA: Diagnosis not present

## 2016-11-01 DIAGNOSIS — M542 Cervicalgia: Secondary | ICD-10-CM | POA: Diagnosis not present

## 2016-11-22 DIAGNOSIS — M542 Cervicalgia: Secondary | ICD-10-CM | POA: Diagnosis not present

## 2016-11-22 DIAGNOSIS — M25512 Pain in left shoulder: Secondary | ICD-10-CM | POA: Diagnosis not present

## 2016-11-22 DIAGNOSIS — M6281 Muscle weakness (generalized): Secondary | ICD-10-CM | POA: Diagnosis not present

## 2016-11-22 DIAGNOSIS — M25612 Stiffness of left shoulder, not elsewhere classified: Secondary | ICD-10-CM | POA: Diagnosis not present

## 2016-11-26 DIAGNOSIS — M542 Cervicalgia: Secondary | ICD-10-CM | POA: Diagnosis not present

## 2016-11-26 DIAGNOSIS — M25512 Pain in left shoulder: Secondary | ICD-10-CM | POA: Diagnosis not present

## 2016-11-26 DIAGNOSIS — M6281 Muscle weakness (generalized): Secondary | ICD-10-CM | POA: Diagnosis not present

## 2016-11-26 DIAGNOSIS — M25612 Stiffness of left shoulder, not elsewhere classified: Secondary | ICD-10-CM | POA: Diagnosis not present

## 2016-12-03 DIAGNOSIS — M25612 Stiffness of left shoulder, not elsewhere classified: Secondary | ICD-10-CM | POA: Diagnosis not present

## 2016-12-03 DIAGNOSIS — M542 Cervicalgia: Secondary | ICD-10-CM | POA: Diagnosis not present

## 2016-12-03 DIAGNOSIS — M6281 Muscle weakness (generalized): Secondary | ICD-10-CM | POA: Diagnosis not present

## 2016-12-03 DIAGNOSIS — M25512 Pain in left shoulder: Secondary | ICD-10-CM | POA: Diagnosis not present

## 2016-12-10 DIAGNOSIS — M542 Cervicalgia: Secondary | ICD-10-CM | POA: Diagnosis not present

## 2016-12-10 DIAGNOSIS — M25612 Stiffness of left shoulder, not elsewhere classified: Secondary | ICD-10-CM | POA: Diagnosis not present

## 2016-12-10 DIAGNOSIS — M6281 Muscle weakness (generalized): Secondary | ICD-10-CM | POA: Diagnosis not present

## 2016-12-10 DIAGNOSIS — M25512 Pain in left shoulder: Secondary | ICD-10-CM | POA: Diagnosis not present

## 2016-12-27 DIAGNOSIS — M25612 Stiffness of left shoulder, not elsewhere classified: Secondary | ICD-10-CM | POA: Diagnosis not present

## 2016-12-27 DIAGNOSIS — M25512 Pain in left shoulder: Secondary | ICD-10-CM | POA: Diagnosis not present

## 2016-12-27 DIAGNOSIS — M6281 Muscle weakness (generalized): Secondary | ICD-10-CM | POA: Diagnosis not present

## 2016-12-27 DIAGNOSIS — M542 Cervicalgia: Secondary | ICD-10-CM | POA: Diagnosis not present

## 2017-01-02 DIAGNOSIS — M6281 Muscle weakness (generalized): Secondary | ICD-10-CM | POA: Diagnosis not present

## 2017-01-02 DIAGNOSIS — M25512 Pain in left shoulder: Secondary | ICD-10-CM | POA: Diagnosis not present

## 2017-01-02 DIAGNOSIS — M542 Cervicalgia: Secondary | ICD-10-CM | POA: Diagnosis not present

## 2017-01-02 DIAGNOSIS — M25612 Stiffness of left shoulder, not elsewhere classified: Secondary | ICD-10-CM | POA: Diagnosis not present

## 2017-01-04 DIAGNOSIS — M25512 Pain in left shoulder: Secondary | ICD-10-CM | POA: Diagnosis not present

## 2017-01-04 DIAGNOSIS — M542 Cervicalgia: Secondary | ICD-10-CM | POA: Diagnosis not present

## 2017-01-04 DIAGNOSIS — M6281 Muscle weakness (generalized): Secondary | ICD-10-CM | POA: Diagnosis not present

## 2017-01-04 DIAGNOSIS — M25612 Stiffness of left shoulder, not elsewhere classified: Secondary | ICD-10-CM | POA: Diagnosis not present

## 2017-01-09 DIAGNOSIS — M542 Cervicalgia: Secondary | ICD-10-CM | POA: Diagnosis not present

## 2017-01-09 DIAGNOSIS — M25612 Stiffness of left shoulder, not elsewhere classified: Secondary | ICD-10-CM | POA: Diagnosis not present

## 2017-01-09 DIAGNOSIS — M6281 Muscle weakness (generalized): Secondary | ICD-10-CM | POA: Diagnosis not present

## 2017-01-09 DIAGNOSIS — M25512 Pain in left shoulder: Secondary | ICD-10-CM | POA: Diagnosis not present

## 2017-01-14 DIAGNOSIS — M25612 Stiffness of left shoulder, not elsewhere classified: Secondary | ICD-10-CM | POA: Diagnosis not present

## 2017-01-14 DIAGNOSIS — M542 Cervicalgia: Secondary | ICD-10-CM | POA: Diagnosis not present

## 2017-01-14 DIAGNOSIS — M6281 Muscle weakness (generalized): Secondary | ICD-10-CM | POA: Diagnosis not present

## 2017-01-14 DIAGNOSIS — M25512 Pain in left shoulder: Secondary | ICD-10-CM | POA: Diagnosis not present

## 2017-02-07 ENCOUNTER — Other Ambulatory Visit: Payer: Self-pay | Admitting: Internal Medicine

## 2017-05-07 IMAGING — MR MR SHOULDER*L* W/O CM
4 of 5 series · 13 of 40 positions shown · non-contrast
Comparison: Radiographs 05/02/2016.

CLINICAL DATA: Left shoulder pain and aching than awakens the
patient at night. No acute injury or prior relevant surgery. Limited
range of motion.

EXAM:
MRI OF THE LEFT SHOULDER WITHOUT CONTRAST
TECHNIQUE: Multiplanar, multisequence MR imaging of the shoulder was performed.
No intravenous contrast was administered.

[Series 6: T2 fat-sat · axial · left · 3.0mm · 0.44mm/px · z∈[-65,-6]mm · 3 of 25 slices shown (1 of 2)]
[im 4/25]
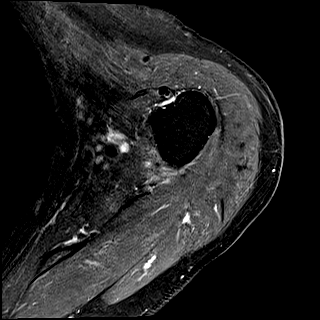
[im 14/25]
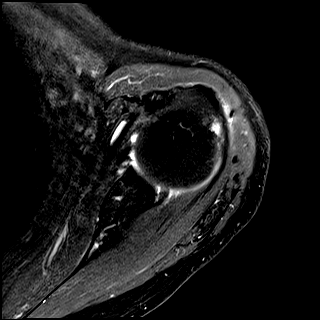
[im 21/25]
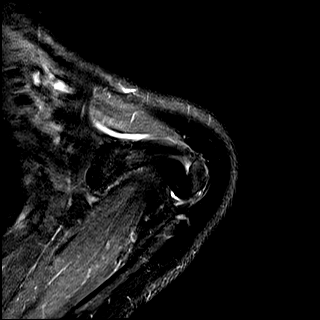

[Series 7: T2 fat-sat · oblique · left · 3.0mm · 0.44mm/px · 3 of 21 slices shown (2 of 2)]
[im 3/21]
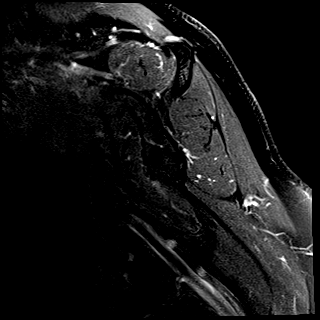
[im 12/21]
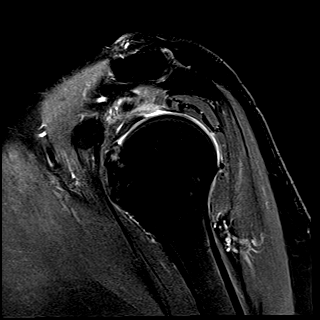
[im 18/21]
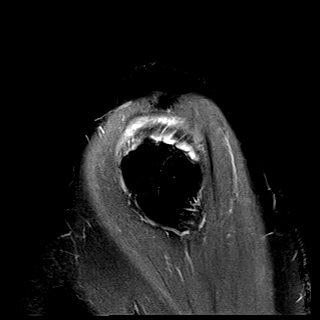

[Series 8: T1 · oblique · left · 3.0mm · 0.18mm/px · 3 of 21 slices shown]
[im 3/21]
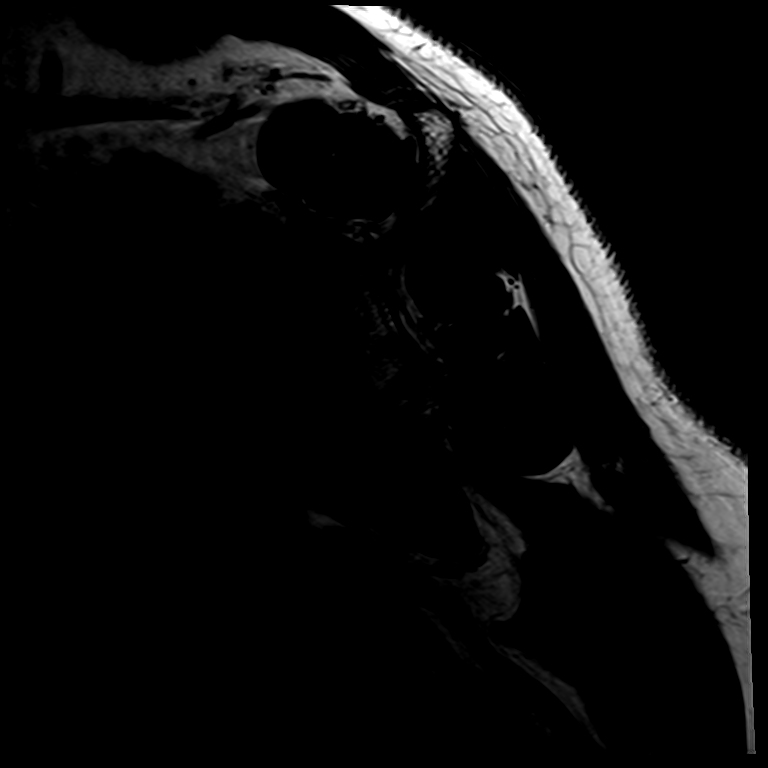
[im 12/21]
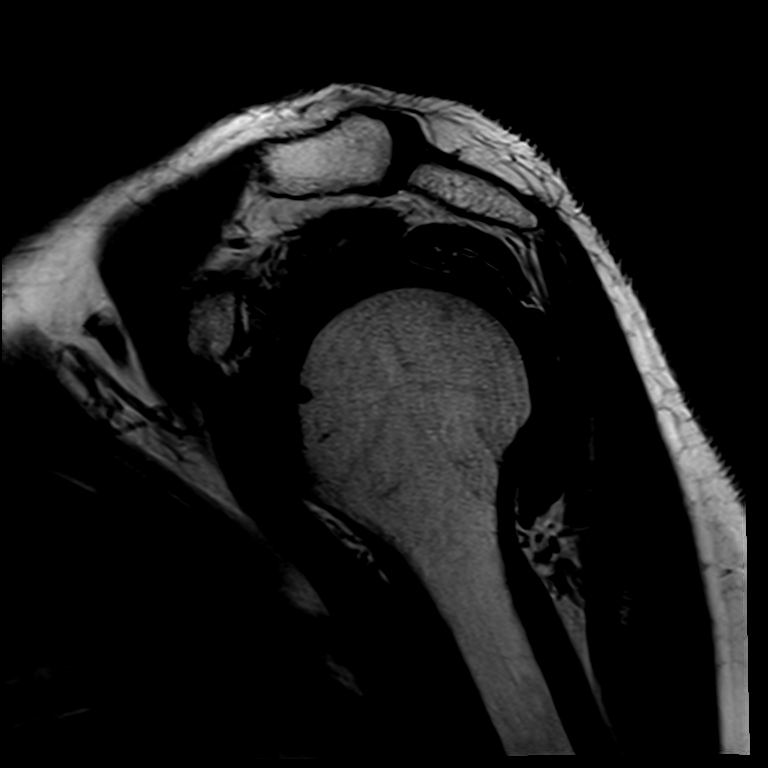
[im 18/21]
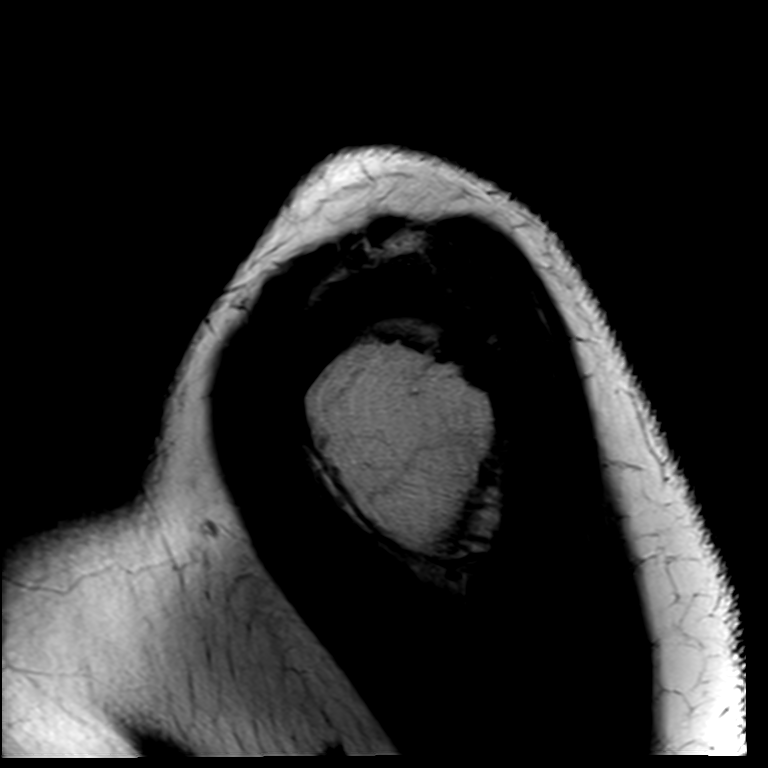

[Series 100: PD · oblique · left · 3.0mm · 0.18mm/px · 4 of 21 slices shown]
[im 1/21]
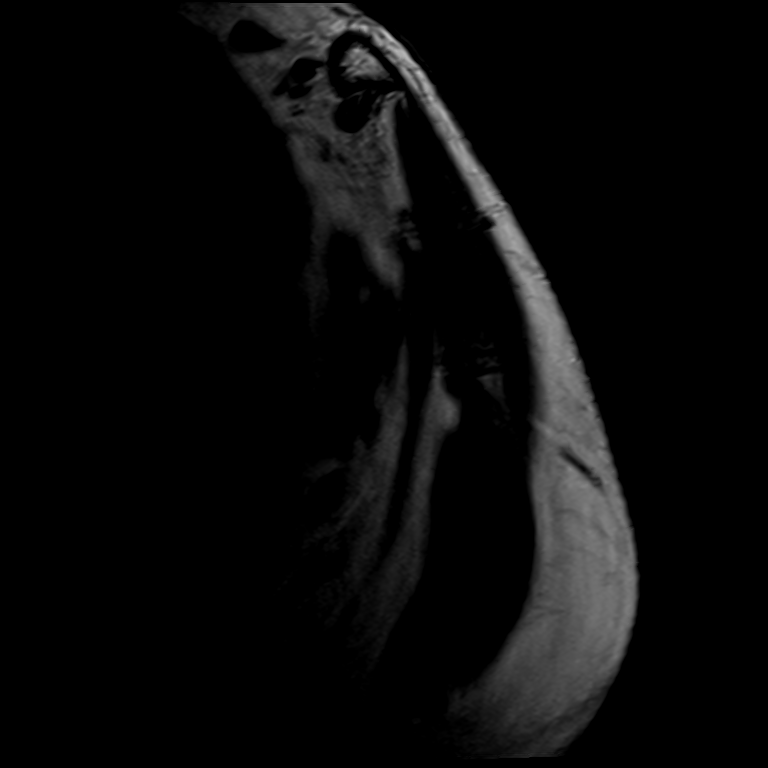
[im 3/21]
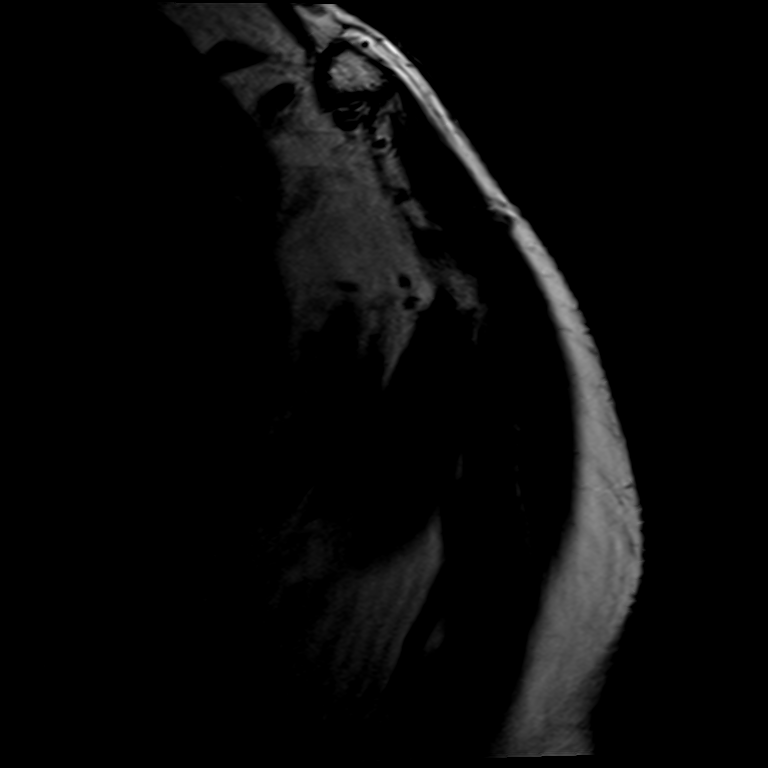
[im 12/21]
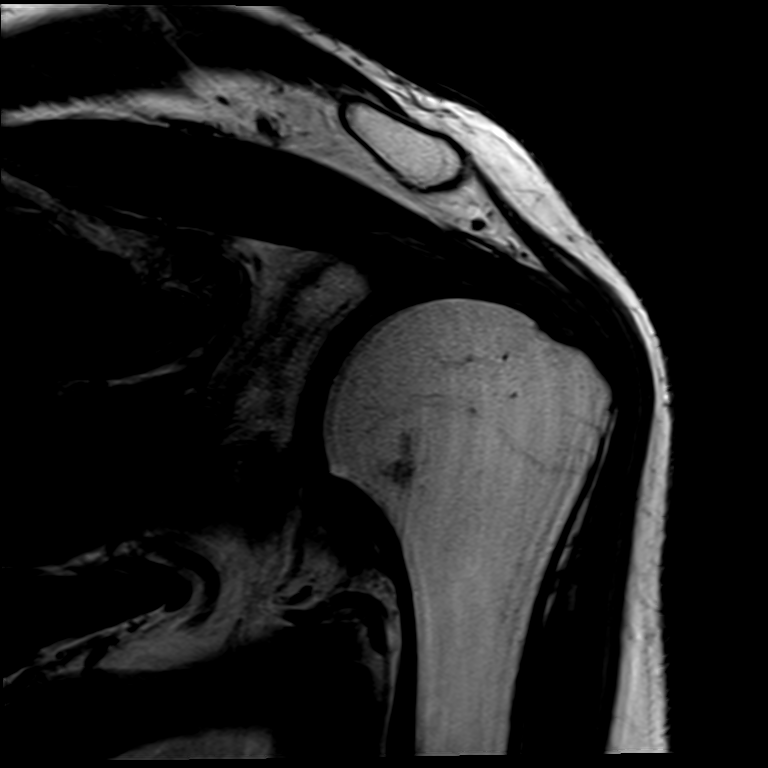
[im 18/21]
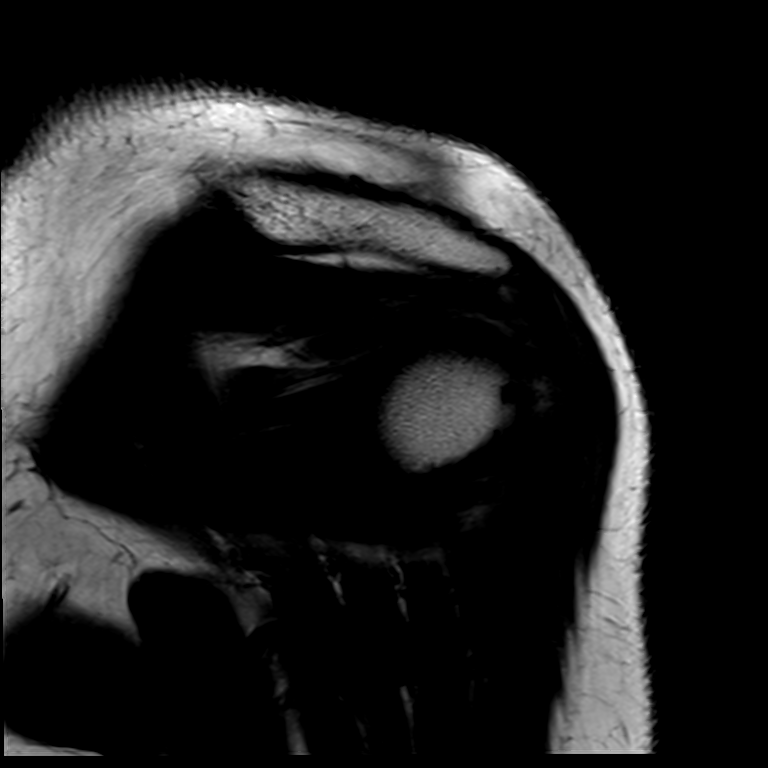

[13 of 40 positions shown; findings below may reference images not displayed]

FINDINGS: Rotator cuff: The supraspinatus tendon is attenuated with partial
articular surface insertional tearing. No full-thickness tear or
tendon retraction is identified. The infraspinatus, subscapularis
and teres minor tendons demonstrate no significant findings.

Muscles:  No focal muscular atrophy or edema.

Biceps long head: Tendinosis of the intra-articular portion. The
tendon is normally located.

Acromioclavicular Joint: The acromion is type 2. There are mild
acromioclavicular degenerative changes. A small amount of fluid is
present in the subacromial -subdeltoid bursa.

Glenohumeral Joint: No significant shoulder joint effusion or
glenohumeral arthropathy.Mild edema surrounding the axillary recess
without obvious capsular tear.

Labrum: There is an irregular tear of the posterosuperior labrum,
best seen on the axial images. No significant paralabral cyst.

Bones: No acute or significant extra-articular osseous findings.

Other: No significant soft tissue findings.
IMPRESSION: 1. Irregular tear of the posterosuperior labrum.
2. Mild bicipital tendinosis.
3. Moderate supraspinatus tendinosis with partial articular surface
insertional tearing. No full-thickness rotator cuff tear.

## 2017-05-10 ENCOUNTER — Other Ambulatory Visit: Payer: Medicare Other | Admitting: Internal Medicine

## 2017-05-10 ENCOUNTER — Other Ambulatory Visit: Payer: Self-pay | Admitting: Internal Medicine

## 2017-05-10 ENCOUNTER — Ambulatory Visit (INDEPENDENT_AMBULATORY_CARE_PROVIDER_SITE_OTHER): Payer: Medicare Other | Admitting: Internal Medicine

## 2017-05-10 ENCOUNTER — Encounter: Payer: Self-pay | Admitting: Internal Medicine

## 2017-05-10 VITALS — BP 126/78 | HR 74 | Temp 97.8°F | Ht 67.0 in | Wt 168.0 lb

## 2017-05-10 DIAGNOSIS — E782 Mixed hyperlipidemia: Secondary | ICD-10-CM

## 2017-05-10 DIAGNOSIS — Z8639 Personal history of other endocrine, nutritional and metabolic disease: Secondary | ICD-10-CM

## 2017-05-10 DIAGNOSIS — J3089 Other allergic rhinitis: Secondary | ICD-10-CM

## 2017-05-10 DIAGNOSIS — M8580 Other specified disorders of bone density and structure, unspecified site: Secondary | ICD-10-CM

## 2017-05-10 DIAGNOSIS — G8929 Other chronic pain: Secondary | ICD-10-CM | POA: Diagnosis not present

## 2017-05-10 DIAGNOSIS — Z0000 Encounter for general adult medical examination without abnormal findings: Secondary | ICD-10-CM

## 2017-05-10 DIAGNOSIS — E892 Postprocedural hypoparathyroidism: Secondary | ICD-10-CM

## 2017-05-10 DIAGNOSIS — Z9009 Acquired absence of other part of head and neck: Secondary | ICD-10-CM

## 2017-05-10 DIAGNOSIS — E785 Hyperlipidemia, unspecified: Secondary | ICD-10-CM | POA: Diagnosis not present

## 2017-05-10 DIAGNOSIS — Z23 Encounter for immunization: Secondary | ICD-10-CM | POA: Diagnosis not present

## 2017-05-10 DIAGNOSIS — M25512 Pain in left shoulder: Secondary | ICD-10-CM

## 2017-05-10 DIAGNOSIS — K219 Gastro-esophageal reflux disease without esophagitis: Secondary | ICD-10-CM

## 2017-05-10 DIAGNOSIS — E559 Vitamin D deficiency, unspecified: Secondary | ICD-10-CM

## 2017-05-10 DIAGNOSIS — Z9889 Other specified postprocedural states: Secondary | ICD-10-CM

## 2017-05-10 LAB — COMPLETE METABOLIC PANEL WITH GFR
AG RATIO: 2.1 (calc) (ref 1.0–2.5)
ALKALINE PHOSPHATASE (APISO): 64 U/L (ref 33–130)
ALT: 9 U/L (ref 6–29)
AST: 15 U/L (ref 10–35)
Albumin: 4.5 g/dL (ref 3.6–5.1)
BUN: 10 mg/dL (ref 7–25)
CALCIUM: 9.3 mg/dL (ref 8.6–10.4)
CO2: 27 mmol/L (ref 20–32)
Chloride: 105 mmol/L (ref 98–110)
Creat: 0.72 mg/dL (ref 0.60–0.93)
GFR, EST NON AFRICAN AMERICAN: 85 mL/min/{1.73_m2} (ref >=60)
GFR, Est African American: 98 mL/min/{1.73_m2} (ref >=60)
Globulin: 2.1 g/dL (calc) (ref 1.9–3.7)
Glucose, Bld: 93 mg/dL (ref 65–99)
POTASSIUM: 4.4 mmol/L (ref 3.5–5.3)
SODIUM: 139 mmol/L (ref 135–146)
Total Bilirubin: 0.6 mg/dL (ref 0.2–1.2)
Total Protein: 6.6 g/dL (ref 6.1–8.1)

## 2017-05-10 LAB — CBC WITH DIFFERENTIAL/PLATELET
BASOS ABS: 18 {cells}/uL (ref 0–200)
Basophils Relative: 0.4 %
EOS PCT: 3.9 %
Eosinophils Absolute: 179 cells/uL (ref 15–500)
HCT: 38.7 % (ref 35.0–45.0)
Hemoglobin: 13 g/dL (ref 11.7–15.5)
Lymphs Abs: 1237 cells/uL (ref 850–3900)
MCH: 30.2 pg (ref 27.0–33.0)
MCHC: 33.6 g/dL (ref 32.0–36.0)
MCV: 89.8 fL (ref 80.0–100.0)
MPV: 10.5 fL (ref 7.5–12.5)
Monocytes Relative: 8 %
NEUTROS PCT: 60.8 %
Neutro Abs: 2797 cells/uL (ref 1500–7800)
PLATELETS: 215 10*3/uL (ref 140–400)
RBC: 4.31 10*6/uL (ref 3.80–5.10)
RDW: 11.9 % (ref 11.0–15.0)
TOTAL LYMPHOCYTE: 26.9 %
WBC mixed population: 368 cells/uL (ref 200–950)
WBC: 4.6 10*3/uL (ref 3.8–10.8)

## 2017-05-10 LAB — LIPID PANEL
Cholesterol: 233 mg/dL — ABNORMAL HIGH (ref <200)
HDL: 61 mg/dL (ref >50)
LDL Cholesterol (Calc): 150 mg/dL (calc) — ABNORMAL HIGH
NON-HDL CHOLESTEROL (CALC): 172 mg/dL — AB (ref <130)
TRIGLYCERIDES: 104 mg/dL (ref <150)
Total CHOL/HDL Ratio: 3.8 (calc) (ref <5.0)

## 2017-05-10 LAB — POCT URINALYSIS DIPSTICK
BILIRUBIN UA: NEGATIVE
GLUCOSE UA: NEGATIVE
Ketones, UA: NEGATIVE
Leukocytes, UA: NEGATIVE
NITRITE UA: NEGATIVE
Protein, UA: NEGATIVE
Spec Grav, UA: 1.02 (ref 1.010–1.025)
Urobilinogen, UA: 0.2 E.U./dL
pH, UA: 7 (ref 5.0–8.0)

## 2017-05-10 LAB — TSH: TSH: 1.34 mIU/L (ref 0.40–4.50)
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
## 2017-05-10 NOTE — Patient Instructions (Signed)
It was a pleasure to see you today. Work on diet exercise and weight loss. Return in 6 months for lipid panel. Flu vaccine given today.

## 2017-05-14 LAB — LIPID PANEL

## 2017-05-14 LAB — CBC WITH DIFFERENTIAL/PLATELET

## 2017-05-14 LAB — COMPLETE METABOLIC PANEL WITH GFR

## 2017-05-14 LAB — TSH

## 2017-08-19 DIAGNOSIS — M810 Age-related osteoporosis without current pathological fracture: Secondary | ICD-10-CM | POA: Diagnosis not present

## 2017-08-19 DIAGNOSIS — Z1231 Encounter for screening mammogram for malignant neoplasm of breast: Secondary | ICD-10-CM | POA: Diagnosis not present

## 2017-08-19 DIAGNOSIS — M8589 Other specified disorders of bone density and structure, multiple sites: Secondary | ICD-10-CM | POA: Diagnosis not present

## 2017-09-02 ENCOUNTER — Encounter: Payer: Self-pay | Admitting: Certified Nurse Midwife

## 2017-10-09 ENCOUNTER — Other Ambulatory Visit: Payer: Self-pay

## 2017-10-09 ENCOUNTER — Ambulatory Visit (INDEPENDENT_AMBULATORY_CARE_PROVIDER_SITE_OTHER): Payer: Medicare Other | Admitting: Certified Nurse Midwife

## 2017-10-09 ENCOUNTER — Ambulatory Visit: Payer: Self-pay | Admitting: Nurse Practitioner

## 2017-10-09 ENCOUNTER — Encounter: Payer: Self-pay | Admitting: Certified Nurse Midwife

## 2017-10-09 ENCOUNTER — Other Ambulatory Visit (HOSPITAL_COMMUNITY)
Admission: RE | Admit: 2017-10-09 | Discharge: 2017-10-09 | Disposition: A | Discharge status: Home / Self Care | Payer: Medicare Other | Source: Ambulatory Visit | Attending: Certified Nurse Midwife | Admitting: Certified Nurse Midwife

## 2017-10-09 VITALS — BP 120/70 | HR 68 | Resp 16 | Ht 67.0 in | Wt 168.0 lb

## 2017-10-09 DIAGNOSIS — Z124 Encounter for screening for malignant neoplasm of cervix: Secondary | ICD-10-CM | POA: Diagnosis not present

## 2017-10-09 DIAGNOSIS — Z8739 Personal history of other diseases of the musculoskeletal system and connective tissue: Secondary | ICD-10-CM

## 2017-10-09 DIAGNOSIS — N952 Postmenopausal atrophic vaginitis: Secondary | ICD-10-CM

## 2017-10-09 DIAGNOSIS — Z01419 Encounter for gynecological examination (general) (routine) without abnormal findings: Secondary | ICD-10-CM

## 2017-10-09 DIAGNOSIS — Z780 Asymptomatic menopausal state: Secondary | ICD-10-CM | POA: Diagnosis not present

## 2017-10-09 NOTE — Progress Notes (Signed)
72 y.o. [id number] Married  Caucasian Fe here for annual exam. Menopausal no HRT. Denies any vaginal bleeding or vaginal dryness. Exercise is walking and treadmill 3-4 times weekly. Weight stays stable. Had previous parathyroid surgery in past with one removal. Spouse is having ED and working with this. Some vaginal dryness and tried OTC lubricant with little success. Recent BMD and mammogram with osteoporosis noted. Has appointment with Dr. Lenord FellersBaxley for management. No other health issues today.  Patient's last menstrual period was 02/11/2000 (approximate).          Sexually active: Yes.    The current method of family planning is post menopausal status.    Exercising: Yes.    walking & weights Smoker:  no  Health Maintenance: Pap:  09-15-14 neg History of Abnormal Pap: no MMG:  08-19-17 category c density birads 1:neg Self Breast exams: yes Colonoscopy:  2015 f/u 5115yrs BMD:   2019 TDaP:  2011 Shingles: 2016 Pneumonia: 2013, 2016 Hep C and HIV: hep c neg 2017 Labs: no with PCP   reports that  has never smoked. she has never used smokeless tobacco. She reports that she drinks alcohol. She reports that she does not use drugs.  Past Medical History:  Diagnosis Date  . Arthritis    Neck/Hands- Not Rheumatoid  . Osteopenia     Past Surgical History:  Procedure Laterality Date  . CATARACT EXTRACTION W/ INTRAOCULAR LENS IMPLANT Left 10/24/2015   Care Everywhere  . CATARACT EXTRACTION W/ INTRAOCULAR LENS IMPLANT Right 11/07/2015   Care Everywhere  . MENISCUS REPAIR  01/01/15  . PARATHYROIDECTOMY  2012    Current Outpatient Medications  Medication Sig Dispense Refill  . cholecalciferol (VITAMIN D) 1000 UNITS tablet Take 1,000 Units by mouth daily.     No current facility-administered medications for this visit.     Family History  Problem Relation Age of Onset  . Hypertension Mother   . Osteoporosis Mother   . Heart disease Mother     ROS:  Pertinent items are noted in HPI.   Otherwise, a comprehensive ROS was negative.  Exam:   LMP 02/11/2000 (Approximate)    Ht Readings from Last 3 Encounters:  05/10/17 5\' 7"  (1.702 m)  10/19/16 5\' 7"  (1.702 m)  10/10/16 5\' 8"  (1.727 m)    General appearance: alert, cooperative and appears stated age Head: Normocephalic, without obvious abnormality, atraumatic Neck: no adenopathy, supple, symmetrical, trachea midline and thyroid normal to inspection and palpation Lungs: clear to auscultation bilaterally Breasts: normal appearance, no masses or tenderness, No nipple retraction or dimpling, No nipple discharge or bleeding, No axillary or supraclavicular adenopathy Heart: regular rate and rhythm Abdomen: soft, non-tender; no masses,  no organomegaly Extremities: extremities normal, atraumatic, no cyanosis or edema Skin: Skin color, texture, turgor normal. No rashes or lesions Lymph nodes: Cervical, supraclavicular, and axillary nodes normal. No abnormal inguinal nodes palpated Neurologic: Grossly normal   Pelvic: External genitalia:  no lesions              Urethra:  normal appearing urethra with no masses, tenderness or lesions              Bartholin's and Skene's: normal                 Vagina: normal appearing vagina with normal color and discharge, no lesions              Cervix: no cervical motion tenderness, no lesions and normal appearance  Pap taken: Yes.   Bimanual Exam:  Uterus:  normal size, contour, position, consistency, mobility, non-tender and anteverted              Adnexa: normal adnexa and no mass, fullness, tenderness               Rectovaginal: Confirms               Anus:  normal sphincter tone, no lesions  Chaperone present: yes  A:  Well Woman with normal exam  Postmenopausal with atrophic vaginitis  New diagnosis of Osteoporosis per patient and appointment with PCP scheduled for management discussion (history of hyperparathyroid)    P:   Reviewed health and wellness pertinent to  exam.  Aware of need to advise if vaginal bleeding occurs.  Discussed vaginal findings and etiology. Discussed options for treatment. Prefers OTC trial of Olive oil or coconut oil. Instructions given. Will advise if no change  Discussed importance of calcium/Vitamin D sources in diet and regular exercise for bone strength. Keep PCP appointment.  Pap smear: yes   counseled on breast self exam, mammography screening, feminine hygiene, adequate intake of calcium and vitamin D, diet and exercise, Kegel's exercises  return annually or prn  An After Visit Summary was printed and given to the patient.

## 2017-10-09 NOTE — Patient Instructions (Signed)

## 2017-10-12 LAB — CYTOLOGY - PAP: Diagnosis: NEGATIVE

## 2017-11-08 ENCOUNTER — Other Ambulatory Visit: Payer: Self-pay

## 2017-11-08 DIAGNOSIS — E785 Hyperlipidemia, unspecified: Secondary | ICD-10-CM

## 2017-11-25 ENCOUNTER — Encounter: Payer: Self-pay | Admitting: Internal Medicine

## 2017-11-25 ENCOUNTER — Other Ambulatory Visit: Payer: Medicare Other | Admitting: Internal Medicine

## 2017-11-25 ENCOUNTER — Ambulatory Visit (INDEPENDENT_AMBULATORY_CARE_PROVIDER_SITE_OTHER): Payer: Medicare Other | Admitting: Internal Medicine

## 2017-11-25 VITALS — BP 120/78 | HR 77 | Ht 67.0 in | Wt 167.0 lb

## 2017-11-25 DIAGNOSIS — M818 Other osteoporosis without current pathological fracture: Secondary | ICD-10-CM

## 2017-11-25 DIAGNOSIS — M810 Age-related osteoporosis without current pathological fracture: Secondary | ICD-10-CM

## 2017-11-25 DIAGNOSIS — E785 Hyperlipidemia, unspecified: Secondary | ICD-10-CM

## 2017-11-25 LAB — LIPID PANEL
CHOL/HDL RATIO: 3.7 (calc) (ref <5.0)
Cholesterol: 217 mg/dL — ABNORMAL HIGH (ref <200)
HDL: 58 mg/dL (ref >50)
LDL CHOLESTEROL (CALC): 138 mg/dL — AB
Non-HDL Cholesterol (Calc): 159 mg/dL (calc) — ABNORMAL HIGH (ref <130)
Triglycerides: 100 mg/dL (ref <150)

## 2017-11-26 ENCOUNTER — Telehealth: Payer: Self-pay | Admitting: Internal Medicine

## 2017-11-26 NOTE — Telephone Encounter (Signed)
Called Patient to let her know we mailed her 2018 Bone Density and her current one plus current labs.

## 2017-11-28 ENCOUNTER — Ambulatory Visit: Payer: Self-pay | Admitting: Internal Medicine

## 2017-12-07 NOTE — Progress Notes (Signed)
   Subjective:    Patient ID: Valerie Sanford, female    DOB: 1946-05-25, 72 y.o.   MRN: [id number]  HPI She is in today to discuss recent bone density study.  It showed osteoporosis.  Discussed various options with her but it seems that she is going to have to have some dental work in the near future and she would prefer not start bisphosphonate therapy at this time.  I would concur with that.  She states that her mother was diagnosed osteoporosis and has never had a fracture.  She has some concern about osteonecrosis of the jaw although she is not immunocompromised or taking immunocompromising medications. The report on the bone density study had not been scanned into Epic.  I was able to locate it after the office visit and review it.  The patient had received a copy of this report a while back. She had bone density studies done at [facility] in 2016.  AP spine was -2.4 and had been -2.10 in 2011.  That was her lowest score on the bone density study.  She had osteopenia in both femoral necks.  Review of Systems see above     Objective:   Physical Exam  She was not examined today but I spent 15 minutes speaking with her about these issues.      Assessment & Plan:  Osteoporosis of spine  Osteopenia of hips  Plan: She will continue vitamin D supplement and follow-up with bone density study in 2 years or or sooner if there is a fracture.

## 2017-12-09 NOTE — Patient Instructions (Signed)
Continue vitamin D supplement.  Have dental work done and we can revisit bisphosphonate therapy in 12 months.

## 2018-05-07 ENCOUNTER — Other Ambulatory Visit: Payer: Self-pay | Admitting: Internal Medicine

## 2018-05-07 DIAGNOSIS — E892 Postprocedural hypoparathyroidism: Secondary | ICD-10-CM

## 2018-05-07 DIAGNOSIS — G8929 Other chronic pain: Secondary | ICD-10-CM

## 2018-05-07 DIAGNOSIS — E782 Mixed hyperlipidemia: Secondary | ICD-10-CM

## 2018-05-07 DIAGNOSIS — K219 Gastro-esophageal reflux disease without esophagitis: Secondary | ICD-10-CM

## 2018-05-07 DIAGNOSIS — Z9009 Acquired absence of other part of head and neck: Secondary | ICD-10-CM

## 2018-05-07 DIAGNOSIS — Z8639 Personal history of other endocrine, nutritional and metabolic disease: Secondary | ICD-10-CM

## 2018-05-07 DIAGNOSIS — M8580 Other specified disorders of bone density and structure, unspecified site: Secondary | ICD-10-CM

## 2018-05-07 DIAGNOSIS — M25512 Pain in left shoulder: Secondary | ICD-10-CM

## 2018-05-07 DIAGNOSIS — Z0000 Encounter for general adult medical examination without abnormal findings: Secondary | ICD-10-CM

## 2018-05-12 ENCOUNTER — Ambulatory Visit (INDEPENDENT_AMBULATORY_CARE_PROVIDER_SITE_OTHER): Payer: Medicare Other | Admitting: Internal Medicine

## 2018-05-12 ENCOUNTER — Encounter: Payer: Self-pay | Admitting: Internal Medicine

## 2018-05-12 ENCOUNTER — Other Ambulatory Visit: Payer: Medicare Other | Admitting: Internal Medicine

## 2018-05-12 VITALS — BP 120/70 | HR 78 | Temp 98.2°F | Ht 67.0 in | Wt 165.0 lb

## 2018-05-12 DIAGNOSIS — M25512 Pain in left shoulder: Secondary | ICD-10-CM

## 2018-05-12 DIAGNOSIS — M8580 Other specified disorders of bone density and structure, unspecified site: Secondary | ICD-10-CM | POA: Diagnosis not present

## 2018-05-12 DIAGNOSIS — M810 Age-related osteoporosis without current pathological fracture: Secondary | ICD-10-CM | POA: Diagnosis not present

## 2018-05-12 DIAGNOSIS — E782 Mixed hyperlipidemia: Secondary | ICD-10-CM | POA: Diagnosis not present

## 2018-05-12 DIAGNOSIS — Z23 Encounter for immunization: Secondary | ICD-10-CM | POA: Diagnosis not present

## 2018-05-12 DIAGNOSIS — F439 Reaction to severe stress, unspecified: Secondary | ICD-10-CM

## 2018-05-12 DIAGNOSIS — G8929 Other chronic pain: Secondary | ICD-10-CM

## 2018-05-12 DIAGNOSIS — Z8639 Personal history of other endocrine, nutritional and metabolic disease: Secondary | ICD-10-CM

## 2018-05-12 DIAGNOSIS — Z0000 Encounter for general adult medical examination without abnormal findings: Secondary | ICD-10-CM

## 2018-05-12 DIAGNOSIS — K219 Gastro-esophageal reflux disease without esophagitis: Secondary | ICD-10-CM | POA: Diagnosis not present

## 2018-05-12 DIAGNOSIS — R8290 Unspecified abnormal findings in urine: Secondary | ICD-10-CM

## 2018-05-12 DIAGNOSIS — Z9009 Acquired absence of other part of head and neck: Secondary | ICD-10-CM

## 2018-05-12 DIAGNOSIS — E892 Postprocedural hypoparathyroidism: Secondary | ICD-10-CM

## 2018-05-12 LAB — COMPLETE METABOLIC PANEL WITH GFR
AG Ratio: 2.1 (calc) (ref 1.0–2.5)
ALT: 9 U/L (ref 6–29)
AST: 16 U/L (ref 10–35)
Albumin: 4.4 g/dL (ref 3.6–5.1)
Alkaline phosphatase (APISO): 57 U/L (ref 33–130)
BUN: 10 mg/dL (ref 7–25)
CALCIUM: 9.6 mg/dL (ref 8.6–10.4)
CO2: 28 mmol/L (ref 20–32)
Chloride: 104 mmol/L (ref 98–110)
Creat: 0.76 mg/dL (ref 0.60–0.93)
GFR, EST AFRICAN AMERICAN: 91 mL/min/{1.73_m2} (ref >=60)
GFR, Est Non African American: 79 mL/min/{1.73_m2} (ref >=60)
GLUCOSE: 90 mg/dL (ref 65–99)
Globulin: 2.1 g/dL (calc) (ref 1.9–3.7)
Potassium: 4.4 mmol/L (ref 3.5–5.3)
Sodium: 138 mmol/L (ref 135–146)
TOTAL PROTEIN: 6.5 g/dL (ref 6.1–8.1)
Total Bilirubin: 0.6 mg/dL (ref 0.2–1.2)

## 2018-05-12 LAB — POCT URINALYSIS DIPSTICK
BILIRUBIN UA: NEGATIVE
Glucose, UA: NEGATIVE
KETONES UA: NEGATIVE
Nitrite, UA: NEGATIVE
PH UA: 6 (ref 5.0–8.0)
Protein, UA: NEGATIVE
SPEC GRAV UA: 1.01 (ref 1.010–1.025)
UROBILINOGEN UA: 0.2 U/dL

## 2018-05-12 LAB — CBC WITH DIFFERENTIAL/PLATELET
BASOS ABS: 9 {cells}/uL (ref 0–200)
Basophils Relative: 0.2 %
EOS PCT: 5.1 %
Eosinophils Absolute: 219 cells/uL (ref 15–500)
HCT: 39.7 % (ref 35.0–45.0)
HEMOGLOBIN: 13.2 g/dL (ref 11.7–15.5)
Lymphs Abs: 1243 cells/uL (ref 850–3900)
MCH: 30 pg (ref 27.0–33.0)
MCHC: 33.2 g/dL (ref 32.0–36.0)
MCV: 90.2 fL (ref 80.0–100.0)
MONOS PCT: 7.2 %
MPV: 10.6 fL (ref 7.5–12.5)
NEUTROS ABS: 2520 {cells}/uL (ref 1500–7800)
Neutrophils Relative %: 58.6 %
Platelets: 231 10*3/uL (ref 140–400)
RBC: 4.4 10*6/uL (ref 3.80–5.10)
RDW: 12.5 % (ref 11.0–15.0)
Total Lymphocyte: 28.9 %
WBC mixed population: 310 cells/uL (ref 200–950)
WBC: 4.3 10*3/uL (ref 3.8–10.8)

## 2018-05-12 LAB — LIPID PANEL
Cholesterol: 226 mg/dL — ABNORMAL HIGH (ref <200)
HDL: 59 mg/dL (ref >50)
LDL Cholesterol (Calc): 147 mg/dL (calc) — ABNORMAL HIGH
Non-HDL Cholesterol (Calc): 167 mg/dL (calc) — ABNORMAL HIGH (ref <130)
Total CHOL/HDL Ratio: 3.8 (calc) (ref <5.0)
Triglycerides: 95 mg/dL (ref <150)

## 2018-05-12 LAB — TSH: TSH: 1.06 mIU/L (ref 0.40–4.50)

## 2018-05-12 NOTE — Patient Instructions (Addendum)
It was a pleasure to see you today.  Labs drawn and pending.  Further instructions to follow once lab work is reviewed.  Flu vaccine given.  Return in 1 year or as needed.
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
## 2018-05-13 LAB — URINE CULTURE
MICRO NUMBER:: 91170994
SPECIMEN QUALITY:: ADEQUATE

## 2018-08-20 ENCOUNTER — Encounter: Payer: Self-pay | Admitting: Internal Medicine

## 2018-08-20 DIAGNOSIS — Z1231 Encounter for screening mammogram for malignant neoplasm of breast: Secondary | ICD-10-CM | POA: Diagnosis not present

## 2018-09-19 DIAGNOSIS — H10023 Other mucopurulent conjunctivitis, bilateral: Secondary | ICD-10-CM | POA: Diagnosis not present

## 2018-10-27 DIAGNOSIS — H26493 Other secondary cataract, bilateral: Secondary | ICD-10-CM | POA: Diagnosis not present

## 2018-10-27 DIAGNOSIS — H43393 Other vitreous opacities, bilateral: Secondary | ICD-10-CM | POA: Diagnosis not present

## 2018-10-28 ENCOUNTER — Ambulatory Visit: Payer: Self-pay | Admitting: Certified Nurse Midwife

## 2019-05-18 ENCOUNTER — Other Ambulatory Visit: Payer: Medicare Other | Admitting: Internal Medicine

## 2019-05-18 ENCOUNTER — Encounter: Payer: Self-pay | Admitting: Internal Medicine

## 2019-05-18 ENCOUNTER — Ambulatory Visit (INDEPENDENT_AMBULATORY_CARE_PROVIDER_SITE_OTHER): Payer: Medicare Other | Admitting: Internal Medicine

## 2019-05-18 ENCOUNTER — Other Ambulatory Visit: Payer: Self-pay

## 2019-05-18 VITALS — BP 118/68 | HR 74 | Temp 98.4°F | Ht 68.0 in | Wt 160.0 lb

## 2019-05-18 DIAGNOSIS — Z23 Encounter for immunization: Secondary | ICD-10-CM

## 2019-05-18 DIAGNOSIS — M810 Age-related osteoporosis without current pathological fracture: Secondary | ICD-10-CM | POA: Diagnosis not present

## 2019-05-18 DIAGNOSIS — E782 Mixed hyperlipidemia: Secondary | ICD-10-CM

## 2019-05-18 DIAGNOSIS — F439 Reaction to severe stress, unspecified: Secondary | ICD-10-CM | POA: Diagnosis not present

## 2019-05-18 DIAGNOSIS — K219 Gastro-esophageal reflux disease without esophagitis: Secondary | ICD-10-CM

## 2019-05-18 DIAGNOSIS — Z0000 Encounter for general adult medical examination without abnormal findings: Secondary | ICD-10-CM

## 2019-05-18 DIAGNOSIS — Z9089 Acquired absence of other organs: Secondary | ICD-10-CM

## 2019-05-18 DIAGNOSIS — Z9889 Other specified postprocedural states: Secondary | ICD-10-CM | POA: Diagnosis not present

## 2019-05-18 DIAGNOSIS — E892 Postprocedural hypoparathyroidism: Secondary | ICD-10-CM

## 2019-05-18 LAB — COMPREHENSIVE METABOLIC PANEL
AG Ratio: 2.2 (calc) (ref 1.0–2.5)
ALT: 12 U/L (ref 6–29)
AST: 17 U/L (ref 10–35)
Albumin: 4.6 g/dL (ref 3.6–5.1)
Alkaline phosphatase (APISO): 59 U/L (ref 37–153)
BUN: 11 mg/dL (ref 7–25)
CO2: 27 mmol/L (ref 20–32)
Calcium: 9.7 mg/dL (ref 8.6–10.4)
Chloride: 103 mmol/L (ref 98–110)
Creat: 0.68 mg/dL (ref 0.60–0.93)
Globulin: 2.1 g/dL (calc) (ref 1.9–3.7)
Glucose, Bld: 88 mg/dL (ref 65–99)
Potassium: 3.9 mmol/L (ref 3.5–5.3)
Sodium: 139 mmol/L (ref 135–146)
Total Bilirubin: 0.7 mg/dL (ref 0.2–1.2)
Total Protein: 6.7 g/dL (ref 6.1–8.1)

## 2019-05-18 LAB — CBC WITH DIFFERENTIAL/PLATELET
Absolute Monocytes: 299 cells/uL (ref 200–950)
Basophils Absolute: 20 cells/uL (ref 0–200)
Basophils Relative: 0.4 %
Eosinophils Absolute: 162 cells/uL (ref 15–500)
Eosinophils Relative: 3.3 %
HCT: 40.8 % (ref 35.0–45.0)
Hemoglobin: 13.8 g/dL (ref 11.7–15.5)
Lymphs Abs: 1215 cells/uL (ref 850–3900)
MCH: 30.6 pg (ref 27.0–33.0)
MCHC: 33.8 g/dL (ref 32.0–36.0)
MCV: 90.5 fL (ref 80.0–100.0)
MPV: 10.7 fL (ref 7.5–12.5)
Monocytes Relative: 6.1 %
Neutro Abs: 3205 cells/uL (ref 1500–7800)
Neutrophils Relative %: 65.4 %
Platelets: 254 10*3/uL (ref 140–400)
RBC: 4.51 10*6/uL (ref 3.80–5.10)
RDW: 12.3 % (ref 11.0–15.0)
Total Lymphocyte: 24.8 %
WBC: 4.9 10*3/uL (ref 3.8–10.8)

## 2019-05-18 LAB — LIPID PANEL
Cholesterol: 229 mg/dL — ABNORMAL HIGH (ref <200)
HDL: 64 mg/dL (ref >=50)
LDL Cholesterol (Calc): 143 mg/dL (calc) — ABNORMAL HIGH
Non-HDL Cholesterol (Calc): 165 mg/dL (calc) — ABNORMAL HIGH (ref <130)
Total CHOL/HDL Ratio: 3.6 (calc) (ref <5.0)
Triglycerides: 107 mg/dL (ref <150)

## 2019-05-18 LAB — POCT URINALYSIS DIPSTICK
Bilirubin, UA: NEGATIVE
Blood, UA: NEGATIVE
Glucose, UA: NEGATIVE
Ketones, UA: NEGATIVE
Leukocytes, UA: NEGATIVE
Nitrite, UA: NEGATIVE
Protein, UA: NEGATIVE
Spec Grav, UA: 1.01 (ref 1.010–1.025)
Urobilinogen, UA: 1 E.U./dL
pH, UA: 6 (ref 5.0–8.0)

## 2019-05-18 LAB — TSH: TSH: 1.59 mIU/L (ref 0.40–4.50)

## 2019-05-18 MED ORDER — ESOMEPRAZOLE MAGNESIUM 40 MG PO CPDR: 40.0000 mg | DELAYED_RELEASE_CAPSULE | Freq: Every day | ORAL | 0 refills | Status: DC

## 2019-06-10 NOTE — Patient Instructions (Signed)
Add letter density study 2021.  Flu vaccine given.  It was a pleasure to see you today.  Return in 1 year or as needed.  Watch diet due to elevated cholesterol.
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
## 2019-08-26 ENCOUNTER — Encounter: Payer: Self-pay | Admitting: Internal Medicine

## 2019-08-26 LAB — HM MAMMOGRAPHY

## 2019-08-27 ENCOUNTER — Encounter: Payer: Self-pay | Admitting: Internal Medicine

## 2019-09-15 ENCOUNTER — Telehealth: Payer: Self-pay

## 2019-09-15 NOTE — Telephone Encounter (Signed)
Spoke with patient regarding BMD results as written by Dr. Hyacinth Meeker. Patient agreeable to plan of care and verbalized understanding. Closing encounter.

## 2019-10-27 NOTE — Progress Notes (Signed)
74 y.o. G48P2002 Married  Caucasian Fe here for annual exam. Post menopausal denies vaginal bleeding. Has vaginal dryness, using Olive oil with good results. Now working in [employer] work. Sees Dr. Renold Genta yearly recent (last 6 months). All labs normal. Recent mammogram and BMD. Osteopenia noted and working with Vitamin D and exercise with Dr. Renold Genta management. No health issues today.  Patient's last menstrual period was 02/11/2000 (approximate).          Sexually active: No.  The current method of family planning is post menopausal status.    Exercising: Yes.    some Smoker:  no  Review of Systems  Constitutional: Negative.   HENT: Negative.   Eyes: Negative.   Respiratory: Negative.   Cardiovascular: Negative.   Gastrointestinal: Negative.   Genitourinary: Negative.   Musculoskeletal: Negative.   Skin: Negative.   Neurological: Negative.   Endo/Heme/Allergies: Negative.   Psychiatric/Behavioral: Negative.     Health Maintenance: Pap:  09-15-14 neg, 10-09-17 neg History of Abnormal Pap: no MMG:  08-26-2019 category c density birads 1:neg Self Breast exams: yes Colonoscopy:  2015 f/u 90yrs BMD:   2021 TDaP:  To check with pcp Shingles: 2016 Pneumonia: 2016 Hep C and HIV: hep c neg 2017 Labs: if needed   reports that she has never smoked. She has never used smokeless tobacco. She reports current alcohol use of about 5.0 standard drinks of alcohol per week. She reports that she does not use drugs.  Past Medical History:  Diagnosis Date  . Arthritis    Neck/Hands- Not Rheumatoid  . Osteopenia     Past Surgical History:  Procedure Laterality Date  . CATARACT EXTRACTION W/ INTRAOCULAR LENS IMPLANT Left 10/24/2015   Care Everywhere  . CATARACT EXTRACTION W/ INTRAOCULAR LENS IMPLANT Right 11/07/2015   Care Everywhere  . MENISCUS REPAIR  01/01/15  . PARATHYROIDECTOMY  2012    Current Outpatient Medications  Medication Sig Dispense Refill  . cholecalciferol (VITAMIN D) 1000  UNITS tablet Take 1,000 Units by mouth daily.     Marland Kitchen esomeprazole (NEXIUM) 40 MG capsule Take 1 capsule (40 mg total) by mouth daily. 90 capsule 0   No current facility-administered medications for this visit.    Family History  Problem Relation Age of Onset  . Hypertension Mother   . Osteoporosis Mother   . Heart disease Mother     ROS:  Pertinent items are noted in HPI.  Otherwise, a comprehensive ROS was negative.  Exam:   BP 114/70   Pulse 68   Temp 98.1 F (36.7 C) (Skin)   Resp 16   Ht 5' 6.75" (1.695 m)   Wt 167 lb (75.8 kg)   LMP 02/11/2000 (Approximate)   BMI 26.35 kg/m  Height: 5' 6.75" (169.5 cm) Ht Readings from Last 3 Encounters:  10/28/19 5' 6.75" (1.695 m)  05/18/19 5\' 8"  (1.727 m)  05/12/18 5\' 7"  (1.702 m)    General appearance: alert, cooperative and appears stated age Head: Normocephalic, without obvious abnormality, atraumatic Neck: no adenopathy, supple, symmetrical, trachea midline and thyroid normal to inspection and palpation Lungs: clear to auscultation bilaterally Breasts: normal appearance, no masses or tenderness, No nipple retraction or dimpling, No nipple discharge or bleeding, No axillary or supraclavicular adenopathy Heart: regular rate and rhythm Abdomen: soft, non-tender; no masses,  no organomegaly Extremities: extremities normal, atraumatic, no cyanosis or edema Skin: Skin color, texture, turgor normal. No rashes or lesions Lymph nodes: Cervical, supraclavicular, and axillary nodes normal. No abnormal inguinal nodes palpated  Neurologic: Grossly normal   Pelvic: External genitalia: Atrophic appearance, no lesions              Urethra:  normal appearing urethra with no masses, tenderness or lesions              Bartholin's and Skene's: normal                 Vagina: slight atrophy noted in vagina with normal color and discharge, no lesions              Cervix: no cervical motion tenderness, no lesions and normal apperance               Pap taken: Yes.   Bimanual Exam:  Uterus:  normal size, contour, position, consistency, mobility, non-tender and anteverted              Adnexa: normal adnexa and no mass, fullness, tenderness               Rectovaginal: Confirms               Anus:  normal sphincter tone, no lesions  Chaperone present: yes  A:  Well Woman with normal exam  Post menopausal no HRT  Atrophic vaginitis using Olive oil  Osteopenia with PCP management  P:   Reviewed health and wellness pertinent to exam  Aware of need to advise if vaginal bleeding  Discussed coconut oil option for dryness and sexual activity. Instructions given.  Continue follow up as indicated, encouraged weight bearing exercise  Pap smear: yes  counseled on breast self exam, mammography screening, feminine hygiene, menopause, adequate intake of calcium and vitamin D, diet and exercise  return annually or prn  An After Visit Summary was printed and given to the patient.

## 2019-10-28 ENCOUNTER — Ambulatory Visit (INDEPENDENT_AMBULATORY_CARE_PROVIDER_SITE_OTHER): Payer: Medicare Other | Admitting: Certified Nurse Midwife

## 2019-10-28 ENCOUNTER — Encounter: Payer: Self-pay | Admitting: Certified Nurse Midwife

## 2019-10-28 ENCOUNTER — Other Ambulatory Visit: Payer: Self-pay

## 2019-10-28 ENCOUNTER — Other Ambulatory Visit (HOSPITAL_COMMUNITY)
Admission: RE | Admit: 2019-10-28 | Discharge: 2019-10-28 | Disposition: A | Discharge status: Home / Self Care | Payer: Medicare Other | Source: Ambulatory Visit | Attending: Obstetrics & Gynecology | Admitting: Obstetrics & Gynecology

## 2019-10-28 VITALS — BP 114/70 | HR 68 | Temp 98.1°F | Resp 16 | Ht 66.75 in | Wt 167.0 lb

## 2019-10-28 DIAGNOSIS — Z01419 Encounter for gynecological examination (general) (routine) without abnormal findings: Secondary | ICD-10-CM | POA: Diagnosis not present

## 2019-10-28 DIAGNOSIS — Z780 Asymptomatic menopausal state: Secondary | ICD-10-CM | POA: Diagnosis not present

## 2019-10-28 DIAGNOSIS — M8580 Other specified disorders of bone density and structure, unspecified site: Secondary | ICD-10-CM | POA: Diagnosis not present

## 2019-10-28 DIAGNOSIS — Z1151 Encounter for screening for human papillomavirus (HPV): Secondary | ICD-10-CM | POA: Diagnosis not present

## 2019-10-28 DIAGNOSIS — Z124 Encounter for screening for malignant neoplasm of cervix: Secondary | ICD-10-CM | POA: Insufficient documentation

## 2019-10-28 NOTE — Patient Instructions (Addendum)
EXERCISE AND DIET:  We recommended that you start or continue a regular exercise program for good health. Regular exercise means any activity that makes your heart beat faster and makes you sweat.  We recommend exercising at least 30 minutes per day at least 3 days a week, preferably 4 or 5.  We also recommend a diet low in fat and sugar.  Inactivity, poor dietary choices and obesity can cause diabetes, heart attack, stroke, and kidney damage, among others.    ALCOHOL AND SMOKING:  Women should limit their alcohol intake to no more than 7 drinks/beers/glasses of wine (combined, not each!) per week. Moderation of alcohol intake to this level decreases your risk of breast cancer and liver damage. And of course, no recreational drugs are part of a healthy lifestyle.  And absolutely no smoking or even second hand smoke. Most people know smoking can cause heart and lung diseases, but did you know it also contributes to weakening of your bones? Aging of your skin?  Yellowing of your teeth and nails?  CALCIUM AND VITAMIN D:  Adequate intake of calcium and Vitamin D are recommended.  The recommendations for exact amounts of these supplements seem to change often, but generally speaking 600 mg of calcium (either carbonate or citrate) and 800 units of Vitamin D per day seems prudent. Certain women may benefit from higher intake of Vitamin D.  If you are among these women, your doctor will have told you during your visit.    PAP SMEARS:  Pap smears, to check for cervical cancer or precancers,  have traditionally been done yearly, although recent scientific advances have shown that most women can have pap smears less often.  However, every woman still should have a physical exam from her gynecologist every year. It will include a breast check, inspection of the vulva and vagina to check for abnormal growths or skin changes, a visual exam of the cervix, and then an exam to evaluate the size and shape of the uterus and  ovaries.  And after 74 years of age, a rectal exam is indicated to check for rectal cancers. We will also provide age appropriate advice regarding health maintenance, like when you should have certain vaccines, screening for sexually transmitted diseases, bone density testing, colonoscopy, mammograms, etc.   MAMMOGRAMS:  All women over 40 years old should have a yearly mammogram. Many facilities now offer a "3D" mammogram, which may cost around $50 extra out of pocket. If possible,  we recommend you accept the option to have the 3D mammogram performed.  It both reduces the number of women who will be called back for extra views which then turn out to be normal, and it is better than the routine mammogram at detecting truly abnormal areas.    COLONOSCOPY:  Colonoscopy to screen for colon cancer is recommended for all women at age 50.  We know, you hate the idea of the prep.  We agree, BUT, having colon cancer and not knowing it is worse!!  Colon cancer so often starts as a polyp that can be seen and removed at colonscopy, which can quite literally save your life!  And if your first colonoscopy is normal and you have no family history of colon cancer, most women don't have to have it again for 10 years.  Once every ten years, you can do something that may end up saving your life, right?  We will be happy to help you get it scheduled when you are ready.    Be sure to check your insurance coverage so you understand how much it will cost.  It may be covered as a preventative service at no cost, but you should check your particular policy.      Osteopenia  Osteopenia is a loss of thickness (density) inside of the bones. Another name for osteopenia is low bone mass. Mild osteopenia is a normal part of aging. It is not a disease, and it does not cause symptoms. However, if you have osteopenia and continue to lose bone mass, you could develop a condition that causes the bones to become thin and break more easily  (osteoporosis). You may also lose some height, have back pain, and have a stooped posture. Although osteopenia is not a disease, making changes to your lifestyle and diet can help to prevent osteopenia from developing into osteoporosis. What are the causes? Osteopenia is caused by loss of calcium in the bones.  Bones are constantly changing. Old bone cells are continually being replaced with new bone cells. This process builds new bone. The mineral calcium is needed to build new bone and maintain bone density. Bone density is usually highest around age 57. After that, most people's bodies cannot replace all the bone they have lost with new bone. What increases the risk? You are more likely to develop this condition if:  You are older than age 11.  You are a woman who went through menopause early.  You have a long illness that keeps you in bed.  You do not get enough exercise.  You lack certain nutrients (malnutrition).  You have an overactive thyroid gland (hyperthyroidism).  You smoke.  You drink a lot of alcohol.  You are taking medicines that weaken the bones, such as steroids. What are the signs or symptoms? This condition does not cause any symptoms. You may have a slightly higher risk for bone breaks (fractures), so getting fractures more easily than normal may be an indication of osteopenia. How is this diagnosed? Your health care provider can diagnose this condition with a special type of X-ray exam that measures bone density (dual-energy X-ray absorptiometry, DEXA). This test can measure bone density in your hips, spine, and wrists. Osteopenia has no symptoms, so this condition is usually diagnosed after a routine bone density screening test is done for osteoporosis. This routine screening is usually done for:  Women who are age 71 or older.  Men who are age 34 or older. If you have risk factors for osteopenia, you may have the screening test at an earlier age. How is this  treated? Making dietary and lifestyle changes can lower your risk for osteoporosis. If you have severe osteopenia that is close to becoming osteoporosis, your health care provider may prescribe medicines and dietary supplements such as calcium and vitamin D. These supplements help to rebuild bone density. Follow these instructions at home:   Take over-the-counter and prescription medicines only as told by your health care provider. These include vitamins and supplements.  Eat a diet that is high in calcium and vitamin D. ? Calcium is found in dairy products, beans, salmon, and leafy green vegetables like spinach and broccoli. ? Look for foods that have vitamin D and calcium added to them (fortified foods), such as orange juice, cereal, and bread.  Do 30 or more minutes of a weight-bearing exercise every day, such as walking, jogging, or playing a sport. These types of exercises strengthen the bones.  Take precautions at home to lower your risk of  falling, such as: ? Keeping rooms well-lit and free of clutter, such as cords. ? Installing safety rails on stairs. ? Using rubber mats in the bathroom or other areas that are often wet or slippery.  Do not use any products that contain nicotine or tobacco, such as cigarettes and e-cigarettes. If you need help quitting, ask your health care provider.  Avoid alcohol or limit alcohol intake to no more than 1 drink a day for nonpregnant women and 2 drinks a day for men. One drink equals 12 oz of beer, 5 oz of wine, or 1 oz of hard liquor.  Keep all follow-up visits as told by your health care provider. This is important. Contact a health care provider if:  You have not had a bone density screening for osteoporosis and you are: ? A woman, age 5 or older. ? A man, age 69 or older.  You are a postmenopausal woman who has not had a bone density screening for osteoporosis.  You are older than age 50 and you want to know if you should have bone  density screening for osteoporosis. Summary  Osteopenia is a loss of thickness (density) inside of the bones. Another name for osteopenia is low bone mass.  Osteopenia is not a disease, but it may increase your risk for a condition that causes the bones to become thin and break more easily (osteoporosis).  You may be at risk for osteopenia if you are older than age 104 or if you are a woman who went through early menopause.  Osteopenia does not cause any symptoms, but it can be diagnosed with a bone density screening test.  Dietary and lifestyle changes are the first treatment for osteopenia. These may lower your risk for osteoporosis. This information is not intended to replace advice given to you by your health care provider. Make sure you discuss any questions you have with your health care provider. Document Revised: 07/12/2017 Document Reviewed: 05/08/2017 Elsevier Patient Education  2020 [employer]  Atrophic Vaginitis Atrophic vaginitis is a condition in which the tissues that line the vagina become dry and thin. This condition occurs in women who have stopped having their period. It is caused by a drop in a female hormone (estrogen). This hormone helps:  To keep the vagina moist.  To make a clear fluid. This clear fluid helps: ? To make the vagina ready for sex. ? To protect the vagina from infection. If the lining of the vagina is dry and thin, it may cause irritation, burning, or itchiness. It may also:  Make sex painful.  Make an exam of your vagina painful.  Cause bleeding.  Make you lose interest in sex.  Cause a burning feeling when you pee (urinate).  Cause a brown or yellow fluid to come from your vagina. Some women do not have symptoms. Follow these instructions at home: Medicines  Take over-the-counter and prescription medicines only as told by your doctor.  Do not use herbs or other medicines unless your doctor says it is okay.  Use medicines for for  dryness. These include: ? Oils to make the vagina soft. ? Creams. ? Moisturizers. General instructions  Do not douche.  Do not use products that can make your vagina dry. These include: ? Scented sprays. ? Scented tampons. ? Scented soaps.  Sex can help increase blood flow and soften the tissue in the vagina. If it hurts to have sex: ? Tell your partner. ? Use products to make sex more  comfortable. Use these only as told by your doctor. Contact a doctor if you:  Have discharge from the vagina that is different than usual.  Have a bad smell coming from your vagina.  Have new symptoms.  Do not get better.  Get worse. Summary  Atrophic vaginitis is a condition in which the lining of the vagina becomes dry and thin.  This condition affects women who have stopped having their periods.  Treatment may include using products that help make the vagina soft.  Call a doctor if do not get better with treatment. This information is not intended to replace advice given to you by your health care provider. Make sure you discuss any questions you have with your health care provider. Document Revised: 08/12/2017 Document Reviewed: 08/12/2017 Elsevier Patient Education  2020 [employer].

## 2019-10-30 LAB — CYTOLOGY - PAP
Comment: NEGATIVE
Diagnosis: NEGATIVE
High risk HPV: NEGATIVE

## 2019-11-03 ENCOUNTER — Encounter: Payer: Self-pay | Admitting: Certified Nurse Midwife

## 2020-05-31 ENCOUNTER — Other Ambulatory Visit: Payer: Self-pay

## 2020-05-31 ENCOUNTER — Other Ambulatory Visit: Payer: Medicare Other | Admitting: Internal Medicine

## 2020-05-31 DIAGNOSIS — Z1329 Encounter for screening for other suspected endocrine disorder: Secondary | ICD-10-CM

## 2020-05-31 DIAGNOSIS — E782 Mixed hyperlipidemia: Secondary | ICD-10-CM

## 2020-05-31 DIAGNOSIS — Z0000 Encounter for general adult medical examination without abnormal findings: Secondary | ICD-10-CM

## 2020-05-31 DIAGNOSIS — M810 Age-related osteoporosis without current pathological fracture: Secondary | ICD-10-CM

## 2020-06-01 LAB — CBC WITH DIFFERENTIAL/PLATELET
Absolute Monocytes: 360 cells/uL (ref 200–950)
Basophils Absolute: 29 cells/uL (ref 0–200)
Basophils Relative: 0.6 %
Eosinophils Absolute: 168 cells/uL (ref 15–500)
Eosinophils Relative: 3.5 %
HCT: 41 % (ref 35.0–45.0)
Hemoglobin: 13.9 g/dL (ref 11.7–15.5)
Lymphs Abs: 1282 cells/uL (ref 850–3900)
MCH: 31 pg (ref 27.0–33.0)
MCHC: 33.9 g/dL (ref 32.0–36.0)
MCV: 91.5 fL (ref 80.0–100.0)
MPV: 10.7 fL (ref 7.5–12.5)
Monocytes Relative: 7.5 %
Neutro Abs: 2962 cells/uL (ref 1500–7800)
Neutrophils Relative %: 61.7 %
Platelets: 230 10*3/uL (ref 140–400)
RBC: 4.48 10*6/uL (ref 3.80–5.10)
RDW: 12.2 % (ref 11.0–15.0)
Total Lymphocyte: 26.7 %
WBC: 4.8 10*3/uL (ref 3.8–10.8)

## 2020-06-01 LAB — LIPID PANEL
Cholesterol: 227 mg/dL — ABNORMAL HIGH (ref <200)
HDL: 64 mg/dL (ref >=50)
LDL Cholesterol (Calc): 144 mg/dL (calc) — ABNORMAL HIGH
Non-HDL Cholesterol (Calc): 163 mg/dL (calc) — ABNORMAL HIGH (ref <130)
Total CHOL/HDL Ratio: 3.5 (calc) (ref <5.0)
Triglycerides: 84 mg/dL (ref <150)

## 2020-06-01 LAB — TSH: TSH: 1.16 mIU/L (ref 0.40–4.50)

## 2020-06-01 LAB — COMPLETE METABOLIC PANEL WITH GFR
AG Ratio: 2.1 (calc) (ref 1.0–2.5)
ALT: 11 U/L (ref 6–29)
AST: 17 U/L (ref 10–35)
Albumin: 4.6 g/dL (ref 3.6–5.1)
Alkaline phosphatase (APISO): 67 U/L (ref 37–153)
BUN: 13 mg/dL (ref 7–25)
CO2: 27 mmol/L (ref 20–32)
Calcium: 9.8 mg/dL (ref 8.6–10.4)
Chloride: 103 mmol/L (ref 98–110)
Creat: 0.67 mg/dL (ref 0.60–0.93)
GFR, Est African American: 101 mL/min/{1.73_m2} (ref >=60)
GFR, Est Non African American: 87 mL/min/{1.73_m2} (ref >=60)
Globulin: 2.2 g/dL (calc) (ref 1.9–3.7)
Glucose, Bld: 94 mg/dL (ref 65–99)
Potassium: 4.2 mmol/L (ref 3.5–5.3)
Sodium: 140 mmol/L (ref 135–146)
Total Bilirubin: 0.6 mg/dL (ref 0.2–1.2)
Total Protein: 6.8 g/dL (ref 6.1–8.1)

## 2020-06-06 ENCOUNTER — Ambulatory Visit (INDEPENDENT_AMBULATORY_CARE_PROVIDER_SITE_OTHER): Payer: Medicare Other | Admitting: Internal Medicine

## 2020-06-06 ENCOUNTER — Other Ambulatory Visit: Payer: Self-pay

## 2020-06-06 ENCOUNTER — Encounter: Payer: Self-pay | Admitting: Internal Medicine

## 2020-06-06 VITALS — BP 130/80 | HR 81 | Ht 66.75 in | Wt 171.0 lb

## 2020-06-06 DIAGNOSIS — E7800 Pure hypercholesterolemia, unspecified: Secondary | ICD-10-CM

## 2020-06-06 DIAGNOSIS — Z0000 Encounter for general adult medical examination without abnormal findings: Secondary | ICD-10-CM | POA: Diagnosis not present

## 2020-06-06 DIAGNOSIS — Z23 Encounter for immunization: Secondary | ICD-10-CM | POA: Diagnosis not present

## 2020-06-06 DIAGNOSIS — E892 Postprocedural hypoparathyroidism: Secondary | ICD-10-CM | POA: Diagnosis not present

## 2020-06-06 DIAGNOSIS — M8580 Other specified disorders of bone density and structure, unspecified site: Secondary | ICD-10-CM | POA: Diagnosis not present

## 2020-06-06 LAB — POCT URINALYSIS DIPSTICK
Appearance: NEGATIVE
Bilirubin, UA: NEGATIVE
Blood, UA: NEGATIVE
Glucose, UA: NEGATIVE
Ketones, UA: NEGATIVE
Leukocytes, UA: NEGATIVE
Nitrite, UA: NEGATIVE
Odor: NEGATIVE
Protein, UA: NEGATIVE
Spec Grav, UA: 1.01 (ref 1.010–1.025)
Urobilinogen, UA: 0.2 E.U./dL
pH, UA: 6.5 (ref 5.0–8.0)

## 2020-06-06 MED ORDER — ROSUVASTATIN CALCIUM 5 MG PO TABS: ORAL_TABLET | ORAL | 3 refills | Status: DC
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
## 2020-07-08 NOTE — Patient Instructions (Addendum)
It was a pleasure to see you today.  Start Crestor 5 mg 3 times a week.  Repeat bone density study in 2 years.  Have Covid update in the near future.  I have lipid panel liver functions in March of taking Crestor regularly.  Physical exam due in 1 year.

## 2020-08-26 LAB — HM MAMMOGRAPHY

## 2020-08-30 ENCOUNTER — Encounter: Payer: Self-pay | Admitting: Internal Medicine

## 2021-06-01 ENCOUNTER — Other Ambulatory Visit: Payer: Medicare Other | Admitting: Internal Medicine

## 2021-06-01 ENCOUNTER — Other Ambulatory Visit: Payer: Self-pay

## 2021-06-01 DIAGNOSIS — M8580 Other specified disorders of bone density and structure, unspecified site: Secondary | ICD-10-CM

## 2021-06-01 DIAGNOSIS — E7800 Pure hypercholesterolemia, unspecified: Secondary | ICD-10-CM

## 2021-06-01 DIAGNOSIS — Z1329 Encounter for screening for other suspected endocrine disorder: Secondary | ICD-10-CM

## 2021-06-01 DIAGNOSIS — Z0000 Encounter for general adult medical examination without abnormal findings: Secondary | ICD-10-CM

## 2021-06-02 LAB — COMPLETE METABOLIC PANEL WITH GFR
AG Ratio: 2.2 (calc) (ref 1.0–2.5)
ALT: 9 U/L (ref 6–29)
AST: 15 U/L (ref 10–35)
Albumin: 4.4 g/dL (ref 3.6–5.1)
Alkaline phosphatase (APISO): 67 U/L (ref 37–153)
BUN: 10 mg/dL (ref 7–25)
CO2: 26 mmol/L (ref 20–32)
Calcium: 9.8 mg/dL (ref 8.6–10.4)
Chloride: 104 mmol/L (ref 98–110)
Creat: 0.65 mg/dL (ref 0.60–1.00)
Globulin: 2 g/dL (calc) (ref 1.9–3.7)
Glucose, Bld: 87 mg/dL (ref 65–99)
Potassium: 4.9 mmol/L (ref 3.5–5.3)
Sodium: 140 mmol/L (ref 135–146)
Total Bilirubin: 0.7 mg/dL (ref 0.2–1.2)
Total Protein: 6.4 g/dL (ref 6.1–8.1)
eGFR: 92 mL/min/{1.73_m2} (ref >=60)

## 2021-06-02 LAB — CBC WITH DIFFERENTIAL/PLATELET
Absolute Monocytes: 338 cells/uL (ref 200–950)
Basophils Absolute: 9 cells/uL (ref 0–200)
Basophils Relative: 0.2 %
Eosinophils Absolute: 160 cells/uL (ref 15–500)
Eosinophils Relative: 3.4 %
HCT: 38.7 % (ref 35.0–45.0)
Hemoglobin: 13.3 g/dL (ref 11.7–15.5)
Lymphs Abs: 1241 cells/uL (ref 850–3900)
MCH: 31.4 pg (ref 27.0–33.0)
MCHC: 34.4 g/dL (ref 32.0–36.0)
MCV: 91.3 fL (ref 80.0–100.0)
MPV: 10.7 fL (ref 7.5–12.5)
Monocytes Relative: 7.2 %
Neutro Abs: 2952 cells/uL (ref 1500–7800)
Neutrophils Relative %: 62.8 %
Platelets: 209 10*3/uL (ref 140–400)
RBC: 4.24 10*6/uL (ref 3.80–5.10)
RDW: 11.9 % (ref 11.0–15.0)
Total Lymphocyte: 26.4 %
WBC: 4.7 10*3/uL (ref 3.8–10.8)

## 2021-06-02 LAB — LIPID PANEL
Cholesterol: 226 mg/dL — ABNORMAL HIGH (ref <200)
HDL: 61 mg/dL (ref >=50)
LDL Cholesterol (Calc): 149 mg/dL (calc) — ABNORMAL HIGH
Non-HDL Cholesterol (Calc): 165 mg/dL (calc) — ABNORMAL HIGH (ref <130)
Total CHOL/HDL Ratio: 3.7 (calc) (ref <5.0)
Triglycerides: 63 mg/dL (ref <150)

## 2021-06-02 LAB — TSH: TSH: 1.5 mIU/L (ref 0.40–4.50)

## 2021-06-08 ENCOUNTER — Other Ambulatory Visit: Payer: Self-pay

## 2021-06-08 ENCOUNTER — Encounter: Payer: Self-pay | Admitting: Internal Medicine

## 2021-06-08 ENCOUNTER — Ambulatory Visit (INDEPENDENT_AMBULATORY_CARE_PROVIDER_SITE_OTHER): Payer: Medicare Other | Admitting: Internal Medicine

## 2021-06-08 VITALS — BP 126/62 | HR 77 | Temp 98.3°F | Ht 67.0 in | Wt 157.0 lb

## 2021-06-08 DIAGNOSIS — E7800 Pure hypercholesterolemia, unspecified: Secondary | ICD-10-CM | POA: Diagnosis not present

## 2021-06-08 DIAGNOSIS — Z0000 Encounter for general adult medical examination without abnormal findings: Secondary | ICD-10-CM | POA: Diagnosis not present

## 2021-06-08 DIAGNOSIS — M8580 Other specified disorders of bone density and structure, unspecified site: Secondary | ICD-10-CM

## 2021-06-08 DIAGNOSIS — E892 Postprocedural hypoparathyroidism: Secondary | ICD-10-CM | POA: Diagnosis not present

## 2021-06-08 DIAGNOSIS — K219 Gastro-esophageal reflux disease without esophagitis: Secondary | ICD-10-CM

## 2021-06-08 DIAGNOSIS — Z23 Encounter for immunization: Secondary | ICD-10-CM

## 2021-06-08 LAB — POCT URINALYSIS DIPSTICK
Bilirubin, UA: NEGATIVE
Blood, UA: NEGATIVE
Glucose, UA: NEGATIVE
Ketones, UA: NEGATIVE
Leukocytes, UA: NEGATIVE
Nitrite, UA: NEGATIVE
Protein, UA: NEGATIVE
Spec Grav, UA: 1.01
Urobilinogen, UA: 0.2 U/dL
pH, UA: 5.5

## 2021-06-08 MED ORDER — ROSUVASTATIN CALCIUM 10 MG PO TABS: 10.0000 mg | ORAL_TABLET | Freq: Every day | ORAL | 11 refills | Status: DC
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
## 2021-08-09 NOTE — Patient Instructions (Addendum)
Patient will return in early 2023 for follow-up on hyperlipidemia with increased dose of lipid medication being prescribed today.  She will see her GYN physician in March 2023 and will follow-up on hyperlipidemia here in February 2023.  It was a pleasure to see you today.

## 2021-09-01 ENCOUNTER — Encounter: Payer: Self-pay | Admitting: Internal Medicine

## 2021-09-08 ENCOUNTER — Telehealth: Payer: Self-pay

## 2021-09-08 NOTE — Telephone Encounter (Signed)
Patient states that she would like to wait until she has another to see how much it changes. She is happy where she is at now and does not wish to pursue treatment at this time.

## 2021-09-08 NOTE — Telephone Encounter (Signed)
LMOM. Dr Lenord Fellers wants to know if she would like to see endocrine for treatment of osteopenia.

## 2021-09-11 ENCOUNTER — Other Ambulatory Visit: Payer: Medicare Other | Admitting: Internal Medicine

## 2021-09-11 ENCOUNTER — Other Ambulatory Visit: Payer: Self-pay

## 2021-09-11 DIAGNOSIS — E7800 Pure hypercholesterolemia, unspecified: Secondary | ICD-10-CM

## 2021-09-11 LAB — HEPATIC FUNCTION PANEL
AG Ratio: 2.3 (calc) (ref 1.0–2.5)
ALT: 16 U/L (ref 6–29)
AST: 20 U/L (ref 10–35)
Albumin: 4.6 g/dL (ref 3.6–5.1)
Alkaline phosphatase (APISO): 67 U/L (ref 37–153)
Bilirubin, Direct: 0.1 mg/dL (ref 0.0–0.2)
Globulin: 2 g/dL (calc) (ref 1.9–3.7)
Indirect Bilirubin: 0.6 mg/dL (calc) (ref 0.2–1.2)
Total Bilirubin: 0.7 mg/dL (ref 0.2–1.2)
Total Protein: 6.6 g/dL (ref 6.1–8.1)

## 2021-09-11 LAB — LIPID PANEL
Cholesterol: 153 mg/dL (ref <200)
HDL: 72 mg/dL (ref >=50)
LDL Cholesterol (Calc): 67 mg/dL (calc)
Non-HDL Cholesterol (Calc): 81 mg/dL (calc) (ref <130)
Total CHOL/HDL Ratio: 2.1 (calc) (ref <5.0)
Triglycerides: 68 mg/dL (ref <150)

## 2021-09-15 ENCOUNTER — Encounter: Payer: Self-pay | Admitting: Internal Medicine

## 2021-09-15 ENCOUNTER — Other Ambulatory Visit: Payer: Self-pay

## 2021-09-15 ENCOUNTER — Ambulatory Visit (INDEPENDENT_AMBULATORY_CARE_PROVIDER_SITE_OTHER): Payer: Medicare Other | Admitting: Internal Medicine

## 2021-09-15 VITALS — BP 118/78 | HR 71 | Temp 97.5°F | Ht 67.0 in | Wt 161.0 lb

## 2021-09-15 DIAGNOSIS — E7800 Pure hypercholesterolemia, unspecified: Secondary | ICD-10-CM

## 2021-09-15 DIAGNOSIS — M8580 Other specified disorders of bone density and structure, unspecified site: Secondary | ICD-10-CM

## 2021-09-15 NOTE — Progress Notes (Signed)
° °  Subjective:    Patient ID: Valerie Sanford, female    DOB: Aug 07, 1946, 76 y.o.   MRN: [id number]  HPI 76 year old Female for discussion of osteopenia and follow up on hyperlipidemia.   Had Medicare wellness visit and evaluation of medical issues in October.  Bone density study January 2021 revealed lowest T score -2.4 in LS spine.   Has not wanted to take bone sparing medication. Does take Vitamin D supplement. This was last checked in 2017 and was low normal at 38. Medicare does not pay for routine Vitamin D testing for osteopenia.   Was on Crestor 5 mg 3 times a week since 2021.    However, in October 2022,  Crestor was increased to 10 mg daily and both lipid  panel and liver functions are now NORMAL.She has no side effects from Crestor.   Review of Systems feels well with no new complaints     Objective:   Physical Exam  BP 118/78 pulse 71 T 97.5 pulse ox 98% weight 161 pounds BMI 25.22  No thyromegaly.  Chest clear.  Cardiac exam regular rate and rhythm without ectopy or murmur.  No lower extremity edema.     Assessment & Plan:  Excellent response to increasing Crestor to 10 mg daily from 5 mg 3 times a week. with normalization of lipid panel on daily Crestor.  Osteopenia-will be due for repeat bone density in 2023.    CPE and Medicare wellness visit due October 2023.  Have reviewed immunizations and they are all up-to-date with exception of Tdap and she could consider Shingrix vaccine as well.  Did have Zostavax in 2016 . Medicare will not pay for Tdap unless there is an injury.

## 2021-09-26 DIAGNOSIS — H01111 Allergic dermatitis of right upper eyelid: Secondary | ICD-10-CM | POA: Diagnosis not present

## 2021-10-01 NOTE — Patient Instructions (Signed)
Order will be sent to [facility] for mammogram and bone density study to be done at your convenience in 2023.  Continue Crestor 10 mg daily.  It was a pleasure to see you today.

## 2021-10-02 ENCOUNTER — Other Ambulatory Visit: Payer: Self-pay

## 2021-10-02 DIAGNOSIS — Z1231 Encounter for screening mammogram for malignant neoplasm of breast: Secondary | ICD-10-CM

## 2021-11-01 ENCOUNTER — Ambulatory Visit: Payer: Medicare Other | Admitting: Obstetrics and Gynecology

## 2021-11-27 NOTE — Progress Notes (Signed)
76 y.o. G89P2002 Married Caucasian female here for annual breast and pelvic exam.   ? ?Father has dementia and mother has been hospitalized recently.  ?Patient and her siblings are caring for them.  ?Situation is stressful. ? ?No GYN concerns other than vaginal dryness.  ?Interested in vaginal suppositories.  ? ?PCP:   Luanna Cole. Lenord Fellers, MD ? ?Patient's last menstrual period was 02/11/2000 (approximate).     ?  ?    ?Sexually active: No.  ?The current method of family planning is post menopausal status.    ?Exercising: No.   Occ walking ?Smoker:  no ? ?Health Maintenance: ?Pap:  10-28-19 Neg:Neg HR HPV, 10-09-17 neg, 09-15-14 Neg ?History of abnormal Pap:  no ?MMG:  09-01-21 Neg/Birads1 ?Colonoscopy:  2015 normal;10 years ?BMD:   09-01-21  Result :Osteopenia ?TDaP:  PCP ?Gardasil:   n/a ?HIV: no ?Hep C: 2017 Neg ?Screening Labs:  PCP ? reports that she has never smoked. She has never used smokeless tobacco. She reports current alcohol use of about 7.0 standard drinks per week. She reports that she does not use drugs. ? ?Past Medical History:  ?Diagnosis Date  ? Arthritis   ? Neck/Hands- Not Rheumatoid  ? Hyperlipidemia   ? Osteopenia   ? ? ?Past Surgical History:  ?Procedure Laterality Date  ? CATARACT EXTRACTION W/ INTRAOCULAR LENS IMPLANT Left 10/24/2015  ? Care Everywhere  ? CATARACT EXTRACTION W/ INTRAOCULAR LENS IMPLANT Right 11/07/2015  ? Care Everywhere  ? MENISCUS REPAIR  01/01/15  ? PARATHYROIDECTOMY  2012  ? ? ?Current Outpatient Medications  ?Medication Sig Dispense Refill  ? Cholecalciferol (VITAMIN D3) 25 MCG (1000 UT) CAPS Take 2 capsules by mouth daily.    ? omeprazole (PRILOSEC) 20 MG capsule Take 20 mg by mouth every other day.    ? rosuvastatin (CRESTOR) 10 MG tablet Take 1 tablet (10 mg total) by mouth daily. 30 tablet 11  ? ?No current facility-administered medications for this visit.  ? ? ?Family History  ?Problem Relation Age of Onset  ? Hypertension Mother   ? Osteoporosis Mother   ? Heart disease Mother    ? ? ?Review of Systems  ?All other systems reviewed and are negative. ? ?Exam:   ?BP 122/62   Pulse 76   Resp 14   Ht 5' 6.5" (1.689 m)   Wt 156 lb (70.8 kg)   LMP 02/11/2000 (Approximate)   BMI 24.80 kg/m?     ?General appearance: alert, cooperative and appears stated age ?Head: normocephalic, without obvious abnormality, atraumatic ?Neck: no adenopathy, supple, symmetrical, trachea midline and thyroid normal to inspection and palpation ?Lungs: clear to auscultation bilaterally ?Breasts: normal appearance, no masses or tenderness, No nipple retraction or dimpling, No nipple discharge or bleeding, No axillary adenopathy ?Heart: regular rate and rhythm ?Abdomen: soft, non-tender; no masses, no organomegaly ?Extremities: extremities normal, atraumatic, no cyanosis or edema ?Skin: skin color, texture, turgor normal. No rashes or lesions ?Lymph nodes: cervical, supraclavicular, and axillary nodes normal. ?Neurologic: grossly normal ? ?Pelvic: External genitalia:  no lesions ?             No abnormal inguinal nodes palpated. ?             Urethra:  normal appearing urethra with no masses, tenderness or lesions ?             Bartholins and Skenes: normal    ?             Vagina: normal appearing vagina  with normal color and discharge, atrophy noted. ?             Cervix: no lesions ?             Pap taken: yes ?Bimanual Exam:  Uterus:  normal size, contour, position, consistency, mobility, non-tender ?             Adnexa: no mass, fullness, tenderness ?             Rectal exam: yes.  Confirms. ?             Anus:  normal sphincter tone, no lesions ? ?Chaperone was present for exam:  Marchelle Folks, CMA ? ?Assessment:   ?Well woman visit with gynecologic exam. ?Vaginal atrophy. ?Osteopenia. ?Caregiver stress.  ? ?Plan: ?Mammogram screening discussed. ?Self breast awareness reviewed. ?Pap collected.  ?Guidelines for Calcium, Vitamin D, regular exercise program including cardiovascular and weight bearing exercise. ?We  discussed vaginal vit E suppositories OTC and prescription vaginal estrogen. ?Patient prefers to use OTC vaginal vit E.  ?BMD in 2 years.  ?We talked about reducing stress through self care and increasing support services for her parents.  ?Follow up annually and prn.  ? ?After visit summary provided.  ? ?30 min  total time was spent for this patient encounter, including preparation, face-to-face counseling with the patient, coordination of care, and documentation of the encounter. ? ? ? ? ?

## 2021-11-29 ENCOUNTER — Other Ambulatory Visit (HOSPITAL_COMMUNITY)
Admission: RE | Admit: 2021-11-29 | Discharge: 2021-11-29 | Disposition: A | Discharge status: Home / Self Care | Payer: Medicare Other | Source: Ambulatory Visit | Attending: Obstetrics and Gynecology | Admitting: Obstetrics and Gynecology

## 2021-11-29 ENCOUNTER — Encounter: Payer: Self-pay | Admitting: Obstetrics and Gynecology

## 2021-11-29 ENCOUNTER — Ambulatory Visit (INDEPENDENT_AMBULATORY_CARE_PROVIDER_SITE_OTHER): Payer: Medicare Other | Admitting: Obstetrics and Gynecology

## 2021-11-29 VITALS — BP 122/62 | HR 76 | Resp 14 | Ht 66.5 in | Wt 156.0 lb

## 2021-11-29 DIAGNOSIS — Z124 Encounter for screening for malignant neoplasm of cervix: Secondary | ICD-10-CM | POA: Insufficient documentation

## 2021-11-29 DIAGNOSIS — Z01419 Encounter for gynecological examination (general) (routine) without abnormal findings: Secondary | ICD-10-CM | POA: Diagnosis not present

## 2021-11-29 DIAGNOSIS — M8580 Other specified disorders of bone density and structure, unspecified site: Secondary | ICD-10-CM

## 2021-11-29 DIAGNOSIS — Z636 Dependent relative needing care at home: Secondary | ICD-10-CM

## 2021-11-29 DIAGNOSIS — N952 Postmenopausal atrophic vaginitis: Secondary | ICD-10-CM | POA: Diagnosis not present

## 2021-11-29 NOTE — Patient Instructions (Signed)

## 2021-12-01 LAB — CYTOLOGY - PAP: Diagnosis: NEGATIVE

## 2022-04-19 DIAGNOSIS — H43393 Other vitreous opacities, bilateral: Secondary | ICD-10-CM | POA: Diagnosis not present

## 2022-05-29 ENCOUNTER — Other Ambulatory Visit: Payer: Self-pay | Admitting: Internal Medicine

## 2022-06-05 ENCOUNTER — Other Ambulatory Visit: Payer: Medicare Other

## 2022-06-05 DIAGNOSIS — E7800 Pure hypercholesterolemia, unspecified: Secondary | ICD-10-CM

## 2022-06-05 DIAGNOSIS — E785 Hyperlipidemia, unspecified: Secondary | ICD-10-CM

## 2022-06-05 DIAGNOSIS — Z136 Encounter for screening for cardiovascular disorders: Secondary | ICD-10-CM | POA: Diagnosis not present

## 2022-06-05 DIAGNOSIS — Z8639 Personal history of other endocrine, nutritional and metabolic disease: Secondary | ICD-10-CM

## 2022-06-06 LAB — CBC WITH DIFFERENTIAL/PLATELET
Absolute Monocytes: 433 cells/uL (ref 200–950)
Basophils Absolute: 23 cells/uL (ref 0–200)
Basophils Relative: 0.4 %
Eosinophils Absolute: 200 cells/uL (ref 15–500)
Eosinophils Relative: 3.5 %
HCT: 37.3 % (ref 35.0–45.0)
Hemoglobin: 12.4 g/dL (ref 11.7–15.5)
Lymphs Abs: 1277 cells/uL (ref 850–3900)
MCH: 30.4 pg (ref 27.0–33.0)
MCHC: 33.2 g/dL (ref 32.0–36.0)
MCV: 91.4 fL (ref 80.0–100.0)
MPV: 10.8 fL (ref 7.5–12.5)
Monocytes Relative: 7.6 %
Neutro Abs: 3768 cells/uL (ref 1500–7800)
Neutrophils Relative %: 66.1 %
Platelets: 196 10*3/uL (ref 140–400)
RBC: 4.08 10*6/uL (ref 3.80–5.10)
RDW: 11.8 % (ref 11.0–15.0)
Total Lymphocyte: 22.4 %
WBC: 5.7 10*3/uL (ref 3.8–10.8)

## 2022-06-06 LAB — COMPLETE METABOLIC PANEL WITH GFR
AG Ratio: 2 (calc) (ref 1.0–2.5)
ALT: 11 U/L (ref 6–29)
AST: 17 U/L (ref 10–35)
Albumin: 4.5 g/dL (ref 3.6–5.1)
Alkaline phosphatase (APISO): 66 U/L (ref 37–153)
BUN: 9 mg/dL (ref 7–25)
CO2: 28 mmol/L (ref 20–32)
Calcium: 9.8 mg/dL (ref 8.6–10.4)
Chloride: 106 mmol/L (ref 98–110)
Creat: 0.62 mg/dL (ref 0.60–1.00)
Globulin: 2.2 g/dL (calc) (ref 1.9–3.7)
Glucose, Bld: 89 mg/dL (ref 65–99)
Potassium: 4.7 mmol/L (ref 3.5–5.3)
Sodium: 142 mmol/L (ref 135–146)
Total Bilirubin: 0.5 mg/dL (ref 0.2–1.2)
Total Protein: 6.7 g/dL (ref 6.1–8.1)
eGFR: 93 mL/min/{1.73_m2} (ref >=60)

## 2022-06-06 LAB — LIPID PANEL
Cholesterol: 139 mg/dL (ref <200)
HDL: 65 mg/dL (ref >=50)
LDL Cholesterol (Calc): 60 mg/dL (calc)
Non-HDL Cholesterol (Calc): 74 mg/dL (calc) (ref <130)
Total CHOL/HDL Ratio: 2.1 (calc) (ref <5.0)
Triglycerides: 68 mg/dL (ref <150)

## 2022-06-06 LAB — TSH: TSH: 1.52 m[IU]/L (ref 0.40–4.50)

## 2022-06-07 ENCOUNTER — Other Ambulatory Visit: Payer: Medicare Other

## 2022-06-11 ENCOUNTER — Encounter: Payer: Self-pay | Admitting: Internal Medicine

## 2022-06-11 ENCOUNTER — Ambulatory Visit (INDEPENDENT_AMBULATORY_CARE_PROVIDER_SITE_OTHER): Payer: Medicare Other | Admitting: Internal Medicine

## 2022-06-11 VITALS — BP 124/74 | HR 68 | Temp 98.1°F | Ht 66.5 in | Wt 158.1 lb

## 2022-06-11 DIAGNOSIS — M8580 Other specified disorders of bone density and structure, unspecified site: Secondary | ICD-10-CM

## 2022-06-11 DIAGNOSIS — R82998 Other abnormal findings in urine: Secondary | ICD-10-CM

## 2022-06-11 DIAGNOSIS — Z0000 Encounter for general adult medical examination without abnormal findings: Secondary | ICD-10-CM

## 2022-06-11 DIAGNOSIS — K219 Gastro-esophageal reflux disease without esophagitis: Secondary | ICD-10-CM | POA: Diagnosis not present

## 2022-06-11 DIAGNOSIS — E892 Postprocedural hypoparathyroidism: Secondary | ICD-10-CM

## 2022-06-11 DIAGNOSIS — E7800 Pure hypercholesterolemia, unspecified: Secondary | ICD-10-CM

## 2022-06-11 LAB — POCT URINALYSIS DIPSTICK
Bilirubin, UA: NEGATIVE
Blood, UA: NEGATIVE
Glucose, UA: NEGATIVE
Ketones, UA: NEGATIVE
Nitrite, UA: NEGATIVE
Protein, UA: NEGATIVE
Spec Grav, UA: <=1.005 — AB (ref 1.010–1.025)
Urobilinogen, UA: 0.2 E.U./dL
pH, UA: 8.5 — AB (ref 5.0–8.0)

## 2022-06-11 MED ORDER — ROSUVASTATIN CALCIUM 10 MG PO TABS: 10.0000 mg | ORAL_TABLET | Freq: Every day | ORAL | 3 refills | Status: DC
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
## 2022-06-11 NOTE — Patient Instructions (Addendum)
Check into vaccines you have had already at [facility].  We understand that you have not had a few that we have recommended.  Vaccines discussed today.  Continue current medications.  Return in 1 year or as needed.  Continue vitamin D supplement.  It was a pleasure to see you today.

## 2022-06-12 LAB — URINE CULTURE
MICRO NUMBER:: 14117887
SPECIMEN QUALITY:: ADEQUATE

## 2022-09-03 DIAGNOSIS — Z1231 Encounter for screening mammogram for malignant neoplasm of breast: Secondary | ICD-10-CM | POA: Diagnosis not present

## 2022-09-04 ENCOUNTER — Encounter: Payer: Self-pay | Admitting: Internal Medicine

## 2022-09-11 ENCOUNTER — Encounter: Payer: Self-pay | Admitting: Internal Medicine
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
## 2023-06-11 ENCOUNTER — Other Ambulatory Visit: Payer: Medicare Other

## 2023-06-11 DIAGNOSIS — E7800 Pure hypercholesterolemia, unspecified: Secondary | ICD-10-CM

## 2023-06-11 DIAGNOSIS — Z0000 Encounter for general adult medical examination without abnormal findings: Secondary | ICD-10-CM | POA: Diagnosis not present

## 2023-06-11 DIAGNOSIS — Z1329 Encounter for screening for other suspected endocrine disorder: Secondary | ICD-10-CM

## 2023-06-11 DIAGNOSIS — M8580 Other specified disorders of bone density and structure, unspecified site: Secondary | ICD-10-CM | POA: Diagnosis not present

## 2023-06-11 DIAGNOSIS — K219 Gastro-esophageal reflux disease without esophagitis: Secondary | ICD-10-CM

## 2023-06-12 LAB — LIPID PANEL
Cholesterol: 152 mg/dL (ref <200)
HDL: 68 mg/dL (ref >=50)
LDL Cholesterol (Calc): 69 mg/dL
Non-HDL Cholesterol (Calc): 84 mg/dL (ref <130)
Total CHOL/HDL Ratio: 2.2 (calc) (ref <5.0)
Triglycerides: 66 mg/dL (ref <150)

## 2023-06-12 LAB — COMPLETE METABOLIC PANEL WITH GFR
AG Ratio: 2.6 (calc) — ABNORMAL HIGH (ref 1.0–2.5)
ALT: 13 U/L (ref 6–29)
AST: 21 U/L (ref 10–35)
Albumin: 4.6 g/dL (ref 3.6–5.1)
Alkaline phosphatase (APISO): 63 U/L (ref 37–153)
BUN: 10 mg/dL (ref 7–25)
CO2: 28 mmol/L (ref 20–32)
Calcium: 9.9 mg/dL (ref 8.6–10.4)
Chloride: 105 mmol/L (ref 98–110)
Creat: 0.64 mg/dL (ref 0.60–1.00)
Globulin: 1.8 g/dL — ABNORMAL LOW (ref 1.9–3.7)
Glucose, Bld: 87 mg/dL (ref 65–99)
Potassium: 4.3 mmol/L (ref 3.5–5.3)
Sodium: 141 mmol/L (ref 135–146)
Total Bilirubin: 0.6 mg/dL (ref 0.2–1.2)
Total Protein: 6.4 g/dL (ref 6.1–8.1)
eGFR: 92 mL/min/{1.73_m2} (ref >=60)

## 2023-06-12 LAB — CBC WITH DIFFERENTIAL/PLATELET
Absolute Lymphocytes: 1180 {cells}/uL (ref 850–3900)
Absolute Monocytes: 374 {cells}/uL (ref 200–950)
Basophils Absolute: 29 {cells}/uL (ref 0–200)
Basophils Relative: 0.7 %
Eosinophils Absolute: 302 {cells}/uL (ref 15–500)
Eosinophils Relative: 7.2 %
HCT: 39.2 % (ref 35.0–45.0)
Hemoglobin: 12.9 g/dL (ref 11.7–15.5)
MCH: 30.8 pg (ref 27.0–33.0)
MCHC: 32.9 g/dL (ref 32.0–36.0)
MCV: 93.6 fL (ref 80.0–100.0)
MPV: 10.9 fL (ref 7.5–12.5)
Monocytes Relative: 8.9 %
Neutro Abs: 2314 {cells}/uL (ref 1500–7800)
Neutrophils Relative %: 55.1 %
Platelets: 209 10*3/uL (ref 140–400)
RBC: 4.19 10*6/uL (ref 3.80–5.10)
RDW: 12 % (ref 11.0–15.0)
Total Lymphocyte: 28.1 %
WBC: 4.2 10*3/uL (ref 3.8–10.8)

## 2023-06-12 LAB — TSH: TSH: 1.74 m[IU]/L (ref 0.40–4.50)

## 2023-06-13 ENCOUNTER — Ambulatory Visit
Admission: RE | Admit: 2023-06-13 | Discharge: 2023-06-13 | Disposition: A | Discharge status: Home / Self Care | Payer: Medicare Other | Source: Ambulatory Visit | Attending: Internal Medicine | Admitting: Internal Medicine

## 2023-06-13 ENCOUNTER — Encounter: Payer: Self-pay | Admitting: Internal Medicine

## 2023-06-13 ENCOUNTER — Ambulatory Visit: Payer: Medicare Other | Admitting: Internal Medicine

## 2023-06-13 VITALS — BP 110/80 | HR 60 | Ht 66.5 in | Wt 162.0 lb

## 2023-06-13 DIAGNOSIS — K219 Gastro-esophageal reflux disease without esophagitis: Secondary | ICD-10-CM | POA: Diagnosis not present

## 2023-06-13 DIAGNOSIS — Z9089 Acquired absence of other organs: Secondary | ICD-10-CM

## 2023-06-13 DIAGNOSIS — M8580 Other specified disorders of bone density and structure, unspecified site: Secondary | ICD-10-CM | POA: Diagnosis not present

## 2023-06-13 DIAGNOSIS — E7800 Pure hypercholesterolemia, unspecified: Secondary | ICD-10-CM

## 2023-06-13 DIAGNOSIS — M25552 Pain in left hip: Secondary | ICD-10-CM | POA: Diagnosis not present

## 2023-06-13 DIAGNOSIS — Z0000 Encounter for general adult medical examination without abnormal findings: Secondary | ICD-10-CM

## 2023-06-13 DIAGNOSIS — Z9889 Other specified postprocedural states: Secondary | ICD-10-CM | POA: Diagnosis not present

## 2023-06-13 NOTE — Patient Instructions (Addendum)
It was a pleasure to see you today.  Please have x-ray of left hip.  May need to see orthopedist if symptoms persist.  Labs are stable.    Vaccines discussed.  Continue current medications including Crestor for lipid control and continue vitamin D supplement.  Addendum: Minimal narrowing in hip joint space.  No severe osteoarthritis.

## 2023-06-18 ENCOUNTER — Other Ambulatory Visit: Payer: Self-pay | Admitting: Internal Medicine

## 2023-09-30 LAB — HM DEXA SCAN

## 2023-09-30 LAB — HM MAMMOGRAPHY

## 2023-10-01 ENCOUNTER — Encounter: Payer: Self-pay | Admitting: Internal Medicine

## 2024-06-08 ENCOUNTER — Other Ambulatory Visit: Payer: Self-pay | Admitting: Internal Medicine

## 2024-06-08 DIAGNOSIS — E782 Mixed hyperlipidemia: Secondary | ICD-10-CM

## 2024-06-11 ENCOUNTER — Other Ambulatory Visit: Payer: BC Managed Care – PPO

## 2024-06-11 ENCOUNTER — Ambulatory Visit: Payer: Self-pay | Admitting: Internal Medicine

## 2024-06-11 DIAGNOSIS — E782 Mixed hyperlipidemia: Secondary | ICD-10-CM

## 2024-06-11 DIAGNOSIS — M8580 Other specified disorders of bone density and structure, unspecified site: Secondary | ICD-10-CM

## 2024-06-11 DIAGNOSIS — Z1329 Encounter for screening for other suspected endocrine disorder: Secondary | ICD-10-CM

## 2024-06-11 DIAGNOSIS — Z0000 Encounter for general adult medical examination without abnormal findings: Secondary | ICD-10-CM

## 2024-06-11 LAB — CBC WITH DIFFERENTIAL/PLATELET
Absolute Lymphocytes: 1174 {cells}/uL (ref 850–3900)
Absolute Monocytes: 336 {cells}/uL (ref 200–950)
Basophils Absolute: 30 {cells}/uL (ref 0–200)
Basophils Relative: 0.5 %
Eosinophils Absolute: 177 {cells}/uL (ref 15–500)
Eosinophils Relative: 3 %
HCT: 41.2 % (ref 35.0–45.0)
Hemoglobin: 13.4 g/dL (ref 11.7–15.5)
MCH: 30.8 pg (ref 27.0–33.0)
MCHC: 32.5 g/dL (ref 32.0–36.0)
MCV: 94.7 fL (ref 80.0–100.0)
MPV: 10.7 fL (ref 7.5–12.5)
Monocytes Relative: 5.7 %
Neutro Abs: 4183 {cells}/uL (ref 1500–7800)
Neutrophils Relative %: 70.9 %
Platelets: 205 Thousand/uL (ref 140–400)
RBC: 4.35 Million/uL (ref 3.80–5.10)
RDW: 12.2 % (ref 11.0–15.0)
Total Lymphocyte: 19.9 %
WBC: 5.9 Thousand/uL (ref 3.8–10.8)

## 2024-06-11 LAB — LIPID PANEL
Cholesterol: 154 mg/dL (ref <200)
HDL: 66 mg/dL (ref >=50)
LDL Cholesterol (Calc): 70 mg/dL
Non-HDL Cholesterol (Calc): 88 mg/dL (ref <130)
Total CHOL/HDL Ratio: 2.3 (calc) (ref <5.0)
Triglycerides: 101 mg/dL (ref <150)

## 2024-06-11 LAB — COMPREHENSIVE METABOLIC PANEL WITH GFR
AG Ratio: 2.4 (calc) (ref 1.0–2.5)
ALT: 12 U/L (ref 6–29)
AST: 19 U/L (ref 10–35)
Albumin: 4.7 g/dL (ref 3.6–5.1)
Alkaline phosphatase (APISO): 57 U/L (ref 37–153)
BUN/Creatinine Ratio: 19 (calc) (ref 6–22)
BUN: 10 mg/dL (ref 7–25)
CO2: 28 mmol/L (ref 20–32)
Calcium: 10.2 mg/dL (ref 8.6–10.4)
Chloride: 106 mmol/L (ref 98–110)
Creat: 0.53 mg/dL — ABNORMAL LOW (ref 0.60–1.00)
Globulin: 2 g/dL (ref 1.9–3.7)
Glucose, Bld: 97 mg/dL (ref 65–99)
Potassium: 4.9 mmol/L (ref 3.5–5.3)
Sodium: 143 mmol/L (ref 135–146)
Total Bilirubin: 0.7 mg/dL (ref 0.2–1.2)
Total Protein: 6.7 g/dL (ref 6.1–8.1)
eGFR: 95 mL/min/1.73m2 (ref >=60)

## 2024-06-11 LAB — TSH: TSH: 1.25 m[IU]/L (ref 0.40–4.50)

## 2024-06-11 NOTE — Progress Notes (Incomplete)
 Annual Wellness Visit   Patient Care Team: Perri Ronal PARAS, MD as PCP - General (Internal Medicine)  Visit Date: 06/11/24   No chief complaint on file.  Subjective:  Patient: Valerie Sanford, Female DOB: [birth year], 78 y.o. MRN: [id number] There were no vitals filed for this visit. Valerie Sanford is a 78 y.o. Female who presents today for her Annual Wellness Visit. Patient has Hyperlipidemia; History of hyperparathyroidism; Osteopenia; Vitamin D  deficiency; GE reflux; and Left shoulder pain on their problem list.    Labs 06/11/2024 {Labs (Optional):31667}   10/01/2023 Mammogram No mammographic evidence of malignancy. Repeat in one year.    10/01/2023 Bone density Left femur neck BMD 0.616 T score -2.10.    Health Maintenance  Topic Date Due   DTaP/Tdap/Td (2 - Td or Tdap) 11/26/2019   Influenza Vaccine  03/13/2024   COVID-19 Vaccine (6 - 2025-26 season) 04/13/2024   Medicare Annual Wellness (AWV)  06/12/2024   Mammogram  09/29/2024   DEXA SCAN  09/29/2025   Pneumococcal Vaccine: 50+ Years  Completed   Hepatitis C Screening  Completed   Zoster Vaccines- Shingrix  Completed   Meningococcal B Vaccine  Aged Out   Colonoscopy  Discontinued    {Man or Woman:32389}  Vaccine Counseling: Due for {Vaccines:32291::Influenza}; UTD on {Vaccines:32291::Influenza}  ROS Objective:  Vitals: body mass index is unknown because there is no height or weight on file.There were no vitals filed for this visit. Physical Exam  Current Outpatient Medications  Medication Instructions   Cholecalciferol (VITAMIN D3) 25 MCG (1000 UT) CAPS 2 capsules, Oral, Daily   omeprazole (PRILOSEC) 20 mg, Oral, Every other day   rosuvastatin  (CRESTOR ) 10 mg, Oral, Daily   Past Medical History:  Diagnosis Date   Arthritis    Neck/Hands- Not Rheumatoid   Hyperlipidemia    Osteopenia    Medical/Surgical History Narrative:  Allergic/Intolerant to:  Allergies  Allergen Reactions   Pyridium   [Phenazopyridine  Hcl] Hives   *** - ***  *** - ***  *** - ***  *** - ***  *** - ***  *** - ***  *** - ***  *** - *** Other - Hx of: *** ; Surghx of: *** Past Surgical History:  Procedure Laterality Date   CATARACT EXTRACTION W/ INTRAOCULAR LENS IMPLANT Left 10/24/2015   Care Everywhere   CATARACT EXTRACTION W/ INTRAOCULAR LENS IMPLANT Right 11/07/2015   Care Everywhere   MENISCUS REPAIR  01/01/15   PARATHYROIDECTOMY  2012   Family History  Problem Relation Age of Onset   Hypertension Mother    Osteoporosis Mother    Heart disease Mother    Family History Narrative: {ELFamHX:31110} Social History   Social History Narrative   Not on file   Most Recent Health Risks Assessment:   Most Recent Social Determinants of Health (Including Hx of Tobacco, Alcohol, and Drug Use) SDOH Screenings   Food Insecurity: No Food Insecurity (06/13/2023)  Housing: Low Risk  (06/13/2023)  Transportation Needs: No Transportation Needs (06/13/2023)  Utilities: Not At Risk (06/13/2023)  Alcohol Screen: Low Risk  (06/13/2023)  Depression (PHQ2-9): Low Risk  (06/13/2023)  Financial Resource Strain: Low Risk  (06/13/2023)  Social Connections: Socially Integrated (06/13/2023)  Stress: No Stress Concern Present (06/13/2023)  Tobacco Use: Low Risk  (03/25/2024)   Received from [facility]  Health Literacy: Adequate Health Literacy (06/13/2023)   Social History   Tobacco Use   Smoking status: Never   Smokeless tobacco: Never  Vaping Use   Vaping  status: Never Used  Substance Use Topics   Alcohol use: Yes    Alcohol/week: 7.0 standard drinks of alcohol    Types: 7 Glasses of wine per week   Drug use: No   Most Recent Functional Status Assessment:    06/13/2023   10:01 AM  In your present state of health, do you have any difficulty performing the following activities:  Hearing? 0  Vision? 0  Difficulty concentrating or making decisions? 0  Walking or climbing stairs? 1   Dressing or bathing? 0  Doing errands, shopping? 0  Preparing Food and eating ? N  Using the Toilet? N  In the past six months, have you accidently leaked urine? N  Do you have problems with loss of bowel control? N  Managing your Medications? N  Managing your Finances? N  Housekeeping or managing your Housekeeping? N   Most Recent Fall Risk Assessment:    06/13/2023   10:00 AM  Fall Risk   Falls in the past year? 0  Number falls in past yr: 0  Injury with Fall? 0  Risk for fall due to : No Fall Risks  Follow up Falls evaluation completed   Most Recent Anxiety/Depression Screenings:    06/13/2023   10:01 AM 06/11/2022    9:57 AM  PHQ 2/9 Scores  PHQ - 2 Score 0 0       No data to display         Most Recent Cognitive Screening:    06/13/2023   10:05 AM  6CIT Screen  What Year? 0 points  What month? 0 points  What time? 0 points  Count back from 20 0 points  Months in reverse 0 points  Repeat phrase 0 points  Total Score 0 points   Most Recent Vision/Hearing Screenings:No results found. Results:  Studies Obtained And Personally Reviewed By Me: Diabetic Foot Exam - Simple   No data filed     {Imaging, colonoscopy, mammogram, bone density scan, echocardiogram, heart cath, stress test, CT calcium  score, etc.:32292}  Labs:  CBC w/ Differential Lab Results  Component Value Date   WBC 4.2 06/11/2023   RBC 4.19 06/11/2023   HGB 12.9 06/11/2023   HCT 39.2 06/11/2023   PLT 209 06/11/2023   MCV 93.6 06/11/2023   MCH 30.8 06/11/2023   MCHC 32.9 06/11/2023   RDW 12.0 06/11/2023   MPV 10.9 06/11/2023   LYMPHSABS 1,277 06/05/2022   MONOABS 360 05/07/2016   BASOSABS 29 06/11/2023    Comprehensive Metabolic Panel Lab Results  Component Value Date   NA 141 06/11/2023   K 4.3 06/11/2023   CL 105 06/11/2023   CO2 28 06/11/2023   GLUCOSE 87 06/11/2023   BUN 10 06/11/2023   CREATININE 0.64 06/11/2023   CALCIUM  9.9 06/11/2023   PROT 6.4 06/11/2023    ALBUMIN 4.3 05/07/2016   AST 21 06/11/2023   ALT 13 06/11/2023   ALKPHOS 55 05/07/2016   BILITOT 0.6 06/11/2023   EGFR 92 06/11/2023   GFRNONAA 87 05/31/2020   Lipid Panel  Lab Results  Component Value Date   CHOL 152 06/11/2023   HDL 68 06/11/2023   LDLCALC 69 06/11/2023   TRIG 66 06/11/2023   A1c Lab Results  Component Value Date   HGBA1C 5.7 (H) 07/02/2011    TSH Lab Results  Component Value Date   TSH 1.74 06/11/2023   PSA{PSA (Optional):32132} No results found for any visits on 06/17/24. Assessment & Plan:  No orders  of the defined types were placed in this encounter.  No orders of the defined types were placed in this encounter.  Other Labs Reviewed today:    No follow-ups on file.   Annual Wellness Visit done today including the all of the following: Reviewed patient's Family Medical History Reviewed patient's SDOH and reviewed tobacco, alcohol, and drug use.  Reviewed and updated list of patient's medical providers Assessment of cognitive impairment was done Assessed patient's functional ability Established a written schedule for health screening services Health Risk Assessent Completed and Reviewed  Discussed health benefits of physical activity, and encouraged her to engage in regular exercise appropriate for her age and condition.   I,Makayla C Reid,acting as a scribe for Ronal JINNY Hailstone, MD.,have documented all relevant documentation on the behalf of Ronal JINNY Hailstone, MD,as directed by  Ronal JINNY Hailstone, MD while in the presence of Ronal JINNY Hailstone, MD.   I, Ronal JINNY Hailstone, MD, have reviewed all documentation for and agree with the above Annual Wellness Visit documentation.  Ronal JINNY Hailstone, MD Internal Medicine 06/17/2024

## 2024-06-15 ENCOUNTER — Ambulatory Visit: Payer: BC Managed Care – PPO | Admitting: Internal Medicine

## 2024-06-17 ENCOUNTER — Ambulatory Visit: Admitting: Internal Medicine

## 2024-06-17 ENCOUNTER — Encounter: Payer: Self-pay | Admitting: Internal Medicine

## 2024-06-17 VITALS — BP 120/80 | Ht 66.5 in | Wt 163.0 lb

## 2024-06-17 DIAGNOSIS — M1612 Unilateral primary osteoarthritis, left hip: Secondary | ICD-10-CM

## 2024-06-17 DIAGNOSIS — Z9889 Other specified postprocedural states: Secondary | ICD-10-CM

## 2024-06-17 DIAGNOSIS — M25552 Pain in left hip: Secondary | ICD-10-CM

## 2024-06-17 DIAGNOSIS — M8580 Other specified disorders of bone density and structure, unspecified site: Secondary | ICD-10-CM

## 2024-06-17 DIAGNOSIS — Z23 Encounter for immunization: Secondary | ICD-10-CM

## 2024-06-17 DIAGNOSIS — Z0000 Encounter for general adult medical examination without abnormal findings: Secondary | ICD-10-CM

## 2024-06-17 DIAGNOSIS — E7800 Pure hypercholesterolemia, unspecified: Secondary | ICD-10-CM | POA: Diagnosis not present

## 2024-06-17 DIAGNOSIS — K219 Gastro-esophageal reflux disease without esophagitis: Secondary | ICD-10-CM

## 2024-06-17 NOTE — Progress Notes (Signed)
 Subjective:   Valerie Sanford is a 78 y.o. female who presents for a Medicare Annual Wellness Visit.  Allergies (verified) Pyridium  [phenazopyridine  hcl]   History: Past Medical History:  Diagnosis Date   Arthritis    Neck/Hands- Not Rheumatoid   Hyperlipidemia    Osteopenia    Past Surgical History:  Procedure Laterality Date   CATARACT EXTRACTION W/ INTRAOCULAR LENS IMPLANT Left 10/24/2015   Care Everywhere   CATARACT EXTRACTION W/ INTRAOCULAR LENS IMPLANT Right 11/07/2015   Care Everywhere   MENISCUS REPAIR  01/01/15   PARATHYROIDECTOMY  2012   Family History  Problem Relation Age of Onset   Hypertension Mother    Osteoporosis Mother    Heart disease Mother    Social History   Occupational History   Not on file  Tobacco Use   Smoking status: Never   Smokeless tobacco: Never  Vaping Use   Vaping status: Never Used  Substance and Sexual Activity   Alcohol use: Yes    Alcohol/week: 7.0 standard drinks of alcohol    Types: 7 Glasses of wine per week   Drug use: No   Sexual activity: Not Currently    Birth control/protection: Post-menopausal    Comment: first intercourse >16   Tobacco Counseling Counseling given: Not Answered  SDOH Screenings   Food Insecurity: No Food Insecurity (06/17/2024)  Housing: Low Risk  (06/17/2024)  Transportation Needs: No Transportation Needs (06/17/2024)  Utilities: Not At Risk (06/17/2024)  Alcohol Screen: Low Risk  (06/17/2024)  Depression (PHQ2-9): Low Risk  (06/17/2024)  Financial Resource Strain: Low Risk  (06/17/2024)  Physical Activity: Insufficiently Active (06/17/2024)  Social Connections: Socially Integrated (06/17/2024)  Stress: No Stress Concern Present (06/17/2024)  Tobacco Use: Low Risk  (06/17/2024)  Health Literacy: Adequate Health Literacy (06/17/2024)   Depression Screen    06/17/2024   10:03 AM 06/13/2023   10:01 AM 06/11/2022    9:57 AM 06/08/2021    2:07 PM 06/06/2020    2:11 PM 05/18/2019   10:09 AM  05/12/2018   11:09 AM  PHQ 2/9 Scores  PHQ - 2 Score 0 0 0 0 0 0 0  PHQ- 9 Score      0      Goals Addressed   None    Visit info / Clinical Intake: Medicare Wellness Visit Mode:: In-person (required for WTM) Interpreter Needed?: No Pre-visit prep was completed: yes AWV questionnaire completed by patient prior to visit?: no Living arrangements:: lives with spouse/significant other Patient's Overall Health Status Rating: excellent Typical amount of pain: some Does pain affect daily life?: no Are you currently prescribed opioids?: no  Dietary Habits and Nutritional Risks How many meals a day?: 5 Eats fruit and vegetables daily?: yes Most meals are obtained by: having others provide food; preparing own meals Diabetic:: no  Functional Status Activities of Daily Living (to include ambulation/medication): Independent Ambulation: Independent Medication Administration: Independent Home Management: Independent Manage your own finances?: yes Primary transportation is: driving Concerns about vision?: no *vision screening is required for WTM* Concerns about hearing?: no  Fall Screening Falls in the past year?: 0 Number of falls in past year: 0  Fall Risk Patient at Risk for Falls Due to: No Fall Risks Fall risk Follow up: Falls prevention discussed; Education provided; Falls evaluation completed  Home and Transportation Safety: All rugs have non-skid backing?: yes All stairs or steps have railings?: yes Grab bars in the bathtub or shower?: yes Have non-skid surface in bathtub or shower?: yes  Good home lighting?: yes Regular seat belt use?: yes Hospital stays in the last year:: no  Cognitive Assessment Difficulty concentrating, remembering, or making decisions? : no Will 6CIT or Mini Cog be Completed: yes What year is it?: 0 points What month is it?: 0 points About what time is it?: 0 points Count backwards from 20 to 1: 0 points Say the months of the year in reverse: 0  points Repeat the address phrase from earlier: 0 points 6 CIT Score: 0 points  Advance Directives (For Healthcare) Does Patient Have a Medical Advance Directive?: Yes Does patient want to make changes to medical advance directive?: No - Patient declined Type of Advance Directive: Living will  Reviewed/Updated  Reviewed/Updated: All        Objective:    Today's Vitals   06/17/24 0949  BP: 120/80  Weight: 163 lb (73.9 kg)  Height: 5' 6.5 (1.689 m)  PainSc: 0-No pain   Body mass index is 25.91 kg/m.  Current Medications (verified) Outpatient Encounter Medications as of 06/17/2024  Medication Sig   Cholecalciferol (VITAMIN D3) 25 MCG (1000 UT) CAPS Take 2 capsules by mouth daily.   omeprazole (PRILOSEC) 20 MG capsule Take 20 mg by mouth every other day.   rosuvastatin  (CRESTOR ) 10 MG tablet TAKE ONE TABLET BY MOUTH EVERY DAY   No facility-administered encounter medications on file as of 06/17/2024.   Hearing/Vision screen No results found. Immunizations and Health Maintenance Health Maintenance  Topic Date Due   DTaP/Tdap/Td (2 - Td or Tdap) 11/26/2019   COVID-19 Vaccine (8 - 2025-26 season) 04/13/2024   Medicare Annual Wellness (AWV)  06/12/2024   Mammogram  09/29/2024   DEXA SCAN  09/29/2025   Pneumococcal Vaccine: 50+ Years  Completed   Influenza Vaccine  Completed   Hepatitis C Screening  Completed   Zoster Vaccines- Shingrix  Completed   Meningococcal B Vaccine  Aged Out   Colonoscopy  Discontinued        Assessment/Plan:  This is a routine wellness examination for [name].  Patient Care Team: Perri Ronal PARAS, MD as PCP - General (Internal Medicine)  I have personally reviewed and noted the following in the patient's chart:   Medical and social history Use of alcohol, tobacco or illicit drugs  Current medications and supplements including opioid prescriptions. Functional ability and status Nutritional status Physical activity Advanced directives List  of other physicians Hospitalizations, surgeries, and ER visits in previous 12 months Vitals Screenings to include cognitive, depression, and falls Referrals and appointments  Orders Placed This Encounter  Procedures   Flu vaccine trivalent PF, 6mos and older(Flulaval,Afluria,Fluarix,Fluzone)   In addition, I have reviewed and discussed with patient certain preventive protocols, quality metrics, and best practice recommendations. A written personalized care plan for preventive services as well as general preventive health recommendations were provided to patient.   Keena Heesch, CMA   06/17/2024   No follow-ups on file.  After Visit Summary: (In Person-Printed) AVS printed and given to the patient  I, Ronal PARAS Perri, MD, have reviewed all documentation for this visit. The documentation on 06/17/2024 for the exam, diagnosis, procedures, and orders are all accurate and complete.

## 2024-06-17 NOTE — Progress Notes (Addendum)
 Subjective:    Patient ID: Valerie Sanford, female    DOB: 1946/06/09, 78 y.o.   MRN: [id number]  HPI 78 year old Female seen today for Annual Health Maintenance Exam. Her lab work including CBC with diff, C-met, lipid panel,  TSH  reviewed  today  and are all excellent.  Patient is doing well physically. Husband dealing with chronic illness. Patient gets regular exercise and stays fit. 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  Past medical history: Hyperlipidemia treated with rosuvastatin  lipid panel was normal.  History of GE reflux treated with omeprazole 20 mg every other day.  History of partial left rotator cuff tear based on MRI in 2018.  History of osteopenia.  Bone density study done February 2025 lowest T-score was in left femoral neck -2.1.  This is consistent with osteopenia.  Not on bone sparing medication at this time as she does not have osteoporosis.  Continues to take Vitamin D  supplementation.  History of bilateral cataract extractions March 2017.  History of allergic rhinitis.  Manages this conservatively.  Has not wanted to be allergy tested.  History of hyperparathyroidism requiring right inferior parathyroidectomy November 2011.  Sustained right posterior horn medial meniscal tear walking down steps and saw Dr. Anderson in 2016.  History of colonoscopy July 2015 with 10-year follow-up recommended.  This was done by Dr. Melrose in [facility].  He has retired.  We can arrange for repeat study up here in [facility] or she may want to consider provider with [facility] if it is more convenient for her.  Social history: Married.  2 children, a son and a daughter in good health.  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  Husband is in the insurance business.  She does not smoke.  Has 1 glass of wine daily.  Family history: Father died at age 29 from an accident.  Mother with history of  pacemaker and atrial fibrillation deceased.  No siblings.  Review of Systems no new complaints.  Had eye exam in [place] recently.  Has some persistent pain in left hip which was mentioned on 2024 exam.  Hip x-ray at that time showed minimal femoroacetabular joint space narrowing.     Objective:   Physical Exam  Blood pressure 120/80 weight 163 pounds BMI 25.91 height 5 feet 6.5 inches  Skin: Warm and dry.  TMs and pharynx are clear.  No cervical adenopathy.  No carotid bruits.  No thyromegaly.  Chest is clear.  Cardiac exam: Regular rate and rhythm without murmur.  Abdomen is soft nondistended without hepatosplenomegaly masses or tenderness.  Breasts are without masses.  No lower extremity pitting edema.  Has superficial varicosities in the lower extremities.  Neurological exam is intact without gross focal deficits.  Affect thought and judgment are normal.      Assessment & Plan:   Mild osteoarthritis left hip  Normal health maintenance exam and normal labs  History of bilateral cataract extractions  History of allergic rhinitis  History of right inferior parathyroidectomy November 2011  History of right medial meniscal tear in remote past  Need for screening colonoscopy  Pure hypercholesterolemia-managed with Crestor  and lipid panel is normal as are liver functions  Plan: Continue Crestor  for hypercholesterolemia.  Flu vaccine given today.  Discussed need for COVID-vaccine and tetanus update to be obtained at local pharmacy.  Consider colonoscopy in the near future.  Patient to  let us  know where she would like to go for this procedure.  Return in 1 year or as needed.   IRonal JINNY Hailstone, MD, have reviewed all documentation for this visit. The documentation on 06/17/2024 for the exam, diagnosis, procedures, and orders are all accurate and complete.

## 2024-06-17 NOTE — Patient Instructions (Signed)
 Ms. Valerie Sanford,  Thank you for taking the time for your Medicare Wellness Visit. I appreciate your continued commitment to your health goals. Please review the care plan we discussed, and feel free to reach out if I can assist you further.  Please note that Annual Wellness Visits do not include a physical exam. Some assessments may be limited, especially if the visit was conducted virtually. If needed, we may recommend an in-person follow-up with your provider.  Ongoing Care Seeing your primary care provider every 3 to 6 months helps us  monitor your health and provide consistent, personalized care.   Referrals If a referral was made during today's visit and you haven't received any updates within two weeks, please contact the referred provider directly to check on the status.  Recommended Screenings:  Health Maintenance  Topic Date Due   DTaP/Tdap/Td vaccine (2 - Td or Tdap) 11/26/2019   COVID-19 Vaccine (8 - 2025-26 season) 04/13/2024   Medicare Annual Wellness Visit  06/12/2024   Breast Cancer Screening  09/29/2024   DEXA scan (bone density measurement)  09/29/2025   Pneumococcal Vaccine for age over 5  Completed   Flu Shot  Completed   Hepatitis C Screening  Completed   Zoster (Shingles) Vaccine  Completed   Meningitis B Vaccine  Aged Out   Colon Cancer Screening  Discontinued       06/17/2024    9:54 AM  Advanced Directives  Does Patient Have a Medical Advance Directive? Yes  Type of Advance Directive Living will  Does patient want to make changes to medical advance directive? No - Patient declined    Vision: Annual vision screenings are recommended for early detection of glaucoma, cataracts, and diabetic retinopathy. These exams can also reveal signs of chronic conditions such as diabetes and high blood pressure.  Dental: Annual dental screenings help detect early signs of oral cancer, gum disease, and other conditions linked to overall health, including heart disease and  diabetes.  Please see the attached documents for additional preventive care recommendations.    Next appointment: Follow up in one year for your annual wellness visit    Preventive Care 65 Years and Older, Female Preventive care refers to lifestyle choices and visits with your health care provider that can promote health and wellness. What does preventive care include? A yearly physical exam. This is also called an annual well check. Dental exams once or twice a year. Routine eye exams. Ask your health care provider how often you should have your eyes checked. Personal lifestyle choices, including: Daily care of your teeth and gums. Regular physical activity. Eating a healthy diet. Avoiding tobacco and drug use. Limiting alcohol use. Practicing safe sex. Taking low-dose aspirin every day. Taking vitamin and mineral supplements as recommended by your health care provider. What happens during an annual well check? The services and screenings done by your health care provider during your annual well check will depend on your age, overall health, lifestyle risk factors, and family history of disease. Counseling  Your health care provider may ask you questions about your: Alcohol use. Tobacco use. Drug use. Emotional well-being. Home and relationship well-being. Sexual activity. Eating habits. History of falls. Memory and ability to understand (cognition). Work and work astronomer. Reproductive health. Screening  You may have the following tests or measurements: Height, weight, and BMI. Blood pressure. Lipid and cholesterol levels. These may be checked every 5 years, or more frequently if you are over 78 years old. Skin check. Lung cancer screening.  You may have this screening every year starting at age 28 if you have a 30-pack-year history of smoking and currently smoke or have quit within the past 15 years. Fecal occult blood test (FOBT) of the stool. You may have this test  every year starting at age 60. Flexible sigmoidoscopy or colonoscopy. You may have a sigmoidoscopy every 5 years or a colonoscopy every 10 years starting at age 49. Hepatitis C blood test. Hepatitis B blood test. Sexually transmitted disease (STD) testing. Diabetes screening. This is done by checking your blood sugar (glucose) after you have not eaten for a while (fasting). You may have this done every 1-3 years. Bone density scan. This is done to screen for osteoporosis. You may have this done starting at age 25. Mammogram. This may be done every 1-2 years. Talk to your health care provider about how often you should have regular mammograms. Talk with your health care provider about your test results, treatment options, and if necessary, the need for more tests. Vaccines  Your health care provider may recommend certain vaccines, such as: Influenza vaccine. This is recommended every year. Tetanus, diphtheria, and acellular pertussis (Tdap, Td) vaccine. You may need a Td booster every 10 years. Zoster vaccine. You may need this after age 72. Pneumococcal 13-valent conjugate (PCV13) vaccine. One dose is recommended after age 69. Pneumococcal polysaccharide (PPSV23) vaccine. One dose is recommended after age 57. Talk to your health care provider about which screenings and vaccines you need and how often you need them. This information is not intended to replace advice given to you by your health care provider. Make sure you discuss any questions you have with your health care provider. Document Released: 08/26/2015 Document Revised: 04/18/2016 Document Reviewed: 05/31/2015 Elsevier Interactive Patient Education  2017 [employer].  Fall Prevention in the Home Falls can cause injuries. They can happen to people of all ages. There are many things you can do to make your home safe and to help prevent falls. What can I do on the outside of my home? Regularly fix the edges of walkways and driveways  and fix any cracks. Remove anything that might make you trip as you walk through a door, such as a raised step or threshold. Trim any bushes or trees on the path to your home. Use bright outdoor lighting. Clear any walking paths of anything that might make someone trip, such as rocks or tools. Regularly check to see if handrails are loose or broken. Make sure that both sides of any steps have handrails. Any raised decks and porches should have guardrails on the edges. Have any leaves, snow, or ice cleared regularly. Use sand or salt on walking paths during winter. Clean up any spills in your garage right away. This includes oil or grease spills. What can I do in the bathroom? Use night lights. Install grab bars by the toilet and in the tub and shower. Do not use towel bars as grab bars. Use non-skid mats or decals in the tub or shower. If you need to sit down in the shower, use a plastic, non-slip stool. Keep the floor dry. Clean up any water that spills on the floor as soon as it happens. Remove soap buildup in the tub or shower regularly. Attach bath mats securely with double-sided non-slip rug tape. Do not have throw rugs and other things on the floor that can make you trip. What can I do in the bedroom? Use night lights. Make sure that you have  a light by your bed that is easy to reach. Do not use any sheets or blankets that are too big for your bed. They should not hang down onto the floor. Have a firm chair that has side arms. You can use this for support while you get dressed. Do not have throw rugs and other things on the floor that can make you trip. What can I do in the kitchen? Clean up any spills right away. Avoid walking on wet floors. Keep items that you use a lot in easy-to-reach places. If you need to reach something above you, use a strong step stool that has a grab bar. Keep electrical cords out of the way. Do not use floor polish or wax that makes floors slippery. If  you must use wax, use non-skid floor wax. Do not have throw rugs and other things on the floor that can make you trip. What can I do with my stairs? Do not leave any items on the stairs. Make sure that there are handrails on both sides of the stairs and use them. Fix handrails that are broken or loose. Make sure that handrails are as long as the stairways. Check any carpeting to make sure that it is firmly attached to the stairs. Fix any carpet that is loose or worn. Avoid having throw rugs at the top or bottom of the stairs. If you do have throw rugs, attach them to the floor with carpet tape. Make sure that you have a light switch at the top of the stairs and the bottom of the stairs. If you do not have them, ask someone to add them for you. What else can I do to help prevent falls? Wear shoes that: Do not have high heels. Have rubber bottoms. Are comfortable and fit you well. Are closed at the toe. Do not wear sandals. If you use a stepladder: Make sure that it is fully opened. Do not climb a closed stepladder. Make sure that both sides of the stepladder are locked into place. Ask someone to hold it for you, if possible. Clearly mark and make sure that you can see: Any grab bars or handrails. First and last steps. Where the edge of each step is. Use tools that help you move around (mobility aids) if they are needed. These include: Canes. Walkers. Scooters. Crutches. Turn on the lights when you go into a dark area. Replace any light bulbs as soon as they burn out. Set up your furniture so you have a clear path. Avoid moving your furniture around. If any of your floors are uneven, fix them. If there are any pets around you, be aware of where they are. Review your medicines with your doctor. Some medicines can make you feel dizzy. This can increase your chance of falling. Ask your doctor what other things that you can do to help prevent falls. This information is not intended to  replace advice given to you by your health care provider. Make sure you discuss any questions you have with your health care provider. Document Released: 05/26/2009 Document Revised: 01/05/2016 Document Reviewed: 09/03/2014 Elsevier Interactive Patient Education  2017 [employer].

## 2025-06-21 ENCOUNTER — Other Ambulatory Visit

## 2025-06-28 ENCOUNTER — Ambulatory Visit: Admitting: Internal Medicine
